# Patient Record
Sex: Female | Born: 1970 | Race: White | Hispanic: No | Smoking: Current every day smoker
Health system: Southern US, Community
[De-identification: ages and names within clinical notes are randomized; demographics above are authoritative.]

## PROBLEM LIST (undated history)

## (undated) DIAGNOSIS — Z9889 Other specified postprocedural states: Secondary | ICD-10-CM

## (undated) DIAGNOSIS — T8859XA Other complications of anesthesia, initial encounter: Secondary | ICD-10-CM

## (undated) DIAGNOSIS — E785 Hyperlipidemia, unspecified: Secondary | ICD-10-CM

## (undated) DIAGNOSIS — T4145XA Adverse effect of unspecified anesthetic, initial encounter: Secondary | ICD-10-CM

## (undated) DIAGNOSIS — E079 Disorder of thyroid, unspecified: Secondary | ICD-10-CM

## (undated) DIAGNOSIS — R112 Nausea with vomiting, unspecified: Secondary | ICD-10-CM

## (undated) HISTORY — PX: HAND SURGERY: SHX662

## (undated) HISTORY — DX: Hyperlipidemia, unspecified: E78.5

## (undated) HISTORY — PX: OTHER SURGICAL HISTORY: SHX169

## (undated) HISTORY — DX: Disorder of thyroid, unspecified: E07.9

---

## 2001-09-16 ENCOUNTER — Encounter: Payer: Self-pay | Admitting: Family Medicine

## 2001-09-16 ENCOUNTER — Ambulatory Visit (HOSPITAL_COMMUNITY): Admission: RE | Admit: 2001-09-16 | Discharge: 2001-09-16 | Payer: Self-pay | Admitting: Family Medicine

## 2001-10-02 ENCOUNTER — Encounter: Payer: Self-pay | Admitting: Family Medicine

## 2001-10-02 ENCOUNTER — Ambulatory Visit (HOSPITAL_COMMUNITY): Admission: RE | Admit: 2001-10-02 | Discharge: 2001-10-02 | Payer: Self-pay | Admitting: Family Medicine

## 2002-06-15 ENCOUNTER — Ambulatory Visit (HOSPITAL_COMMUNITY): Admission: RE | Admit: 2002-06-15 | Discharge: 2002-06-15 | Payer: Self-pay | Admitting: Family Medicine

## 2002-06-15 ENCOUNTER — Encounter: Payer: Self-pay | Admitting: Family Medicine

## 2002-11-05 ENCOUNTER — Encounter: Payer: Self-pay | Admitting: Family Medicine

## 2002-11-05 ENCOUNTER — Ambulatory Visit (HOSPITAL_COMMUNITY): Admission: RE | Admit: 2002-11-05 | Discharge: 2002-11-05 | Payer: Self-pay | Admitting: Family Medicine

## 2003-07-27 ENCOUNTER — Ambulatory Visit (HOSPITAL_COMMUNITY): Admission: RE | Admit: 2003-07-27 | Discharge: 2003-07-27 | Payer: Self-pay | Admitting: Family Medicine

## 2003-09-07 ENCOUNTER — Encounter (INDEPENDENT_AMBULATORY_CARE_PROVIDER_SITE_OTHER): Payer: Self-pay | Admitting: Specialist

## 2003-09-07 ENCOUNTER — Ambulatory Visit (HOSPITAL_COMMUNITY): Admission: RE | Admit: 2003-09-07 | Discharge: 2003-09-08 | Payer: Self-pay | Admitting: General Surgery

## 2003-09-10 ENCOUNTER — Inpatient Hospital Stay (HOSPITAL_COMMUNITY): Admission: EM | Admit: 2003-09-10 | Discharge: 2003-09-14 | Payer: Self-pay | Admitting: Emergency Medicine

## 2003-09-15 ENCOUNTER — Encounter (HOSPITAL_COMMUNITY): Admission: RE | Admit: 2003-09-15 | Discharge: 2003-10-15 | Payer: Self-pay | Admitting: General Surgery

## 2003-09-19 ENCOUNTER — Emergency Department (HOSPITAL_COMMUNITY): Admission: EM | Admit: 2003-09-19 | Discharge: 2003-09-19 | Payer: Self-pay | Admitting: *Deleted

## 2004-03-31 ENCOUNTER — Emergency Department (HOSPITAL_COMMUNITY): Admission: EM | Admit: 2004-03-31 | Discharge: 2004-04-01 | Payer: Self-pay | Admitting: Emergency Medicine

## 2004-07-11 IMAGING — CR DG CHEST 2V
2 series · 2 of 2 positions shown · non-contrast
Comparison: none

CLINICAL DATA: Smoker, hypothyroidism, thyroid goiter, pre-thyroidectomy evaluation.
 TWO VIEW CHEST ? 09/02/03 
 Normal-sized heart.  Minimal diffuse peribronchial thickening.  Minimal thoracic spine degenerative changes.

[view not recorded (1 of 2)]
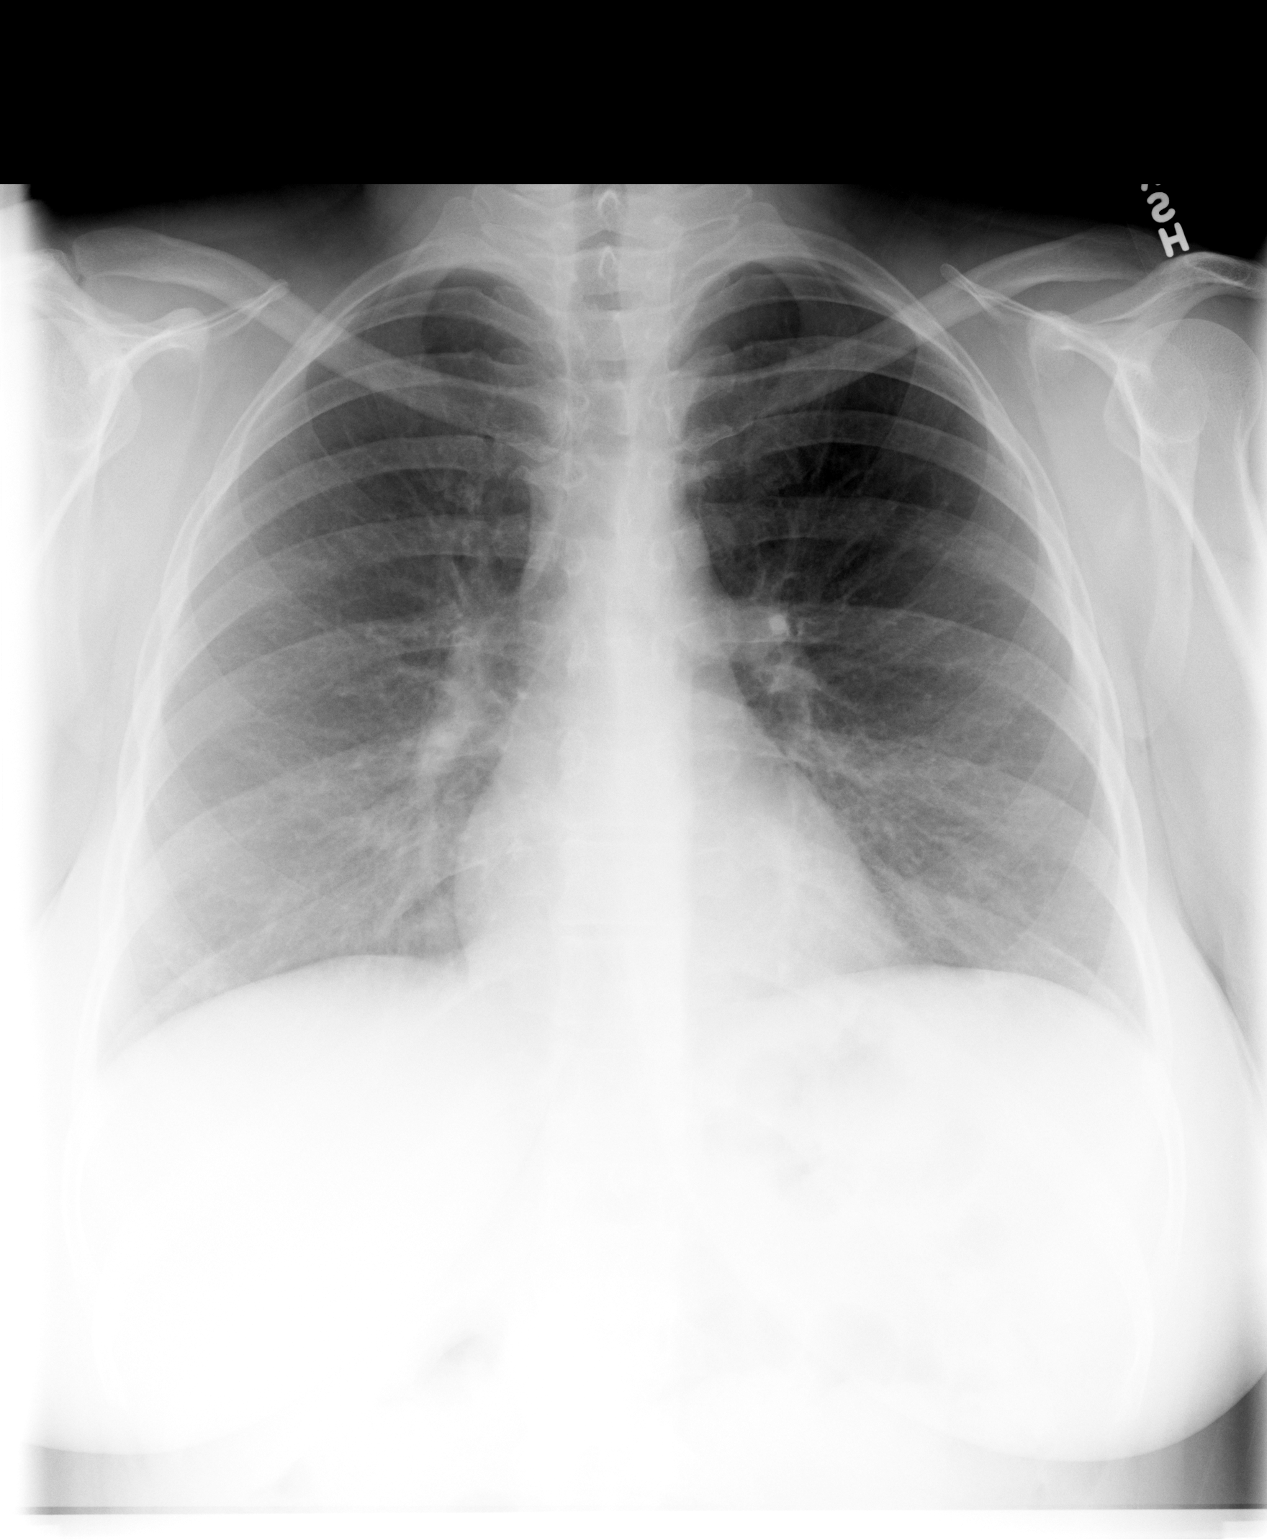

[view not recorded (2 of 2)]
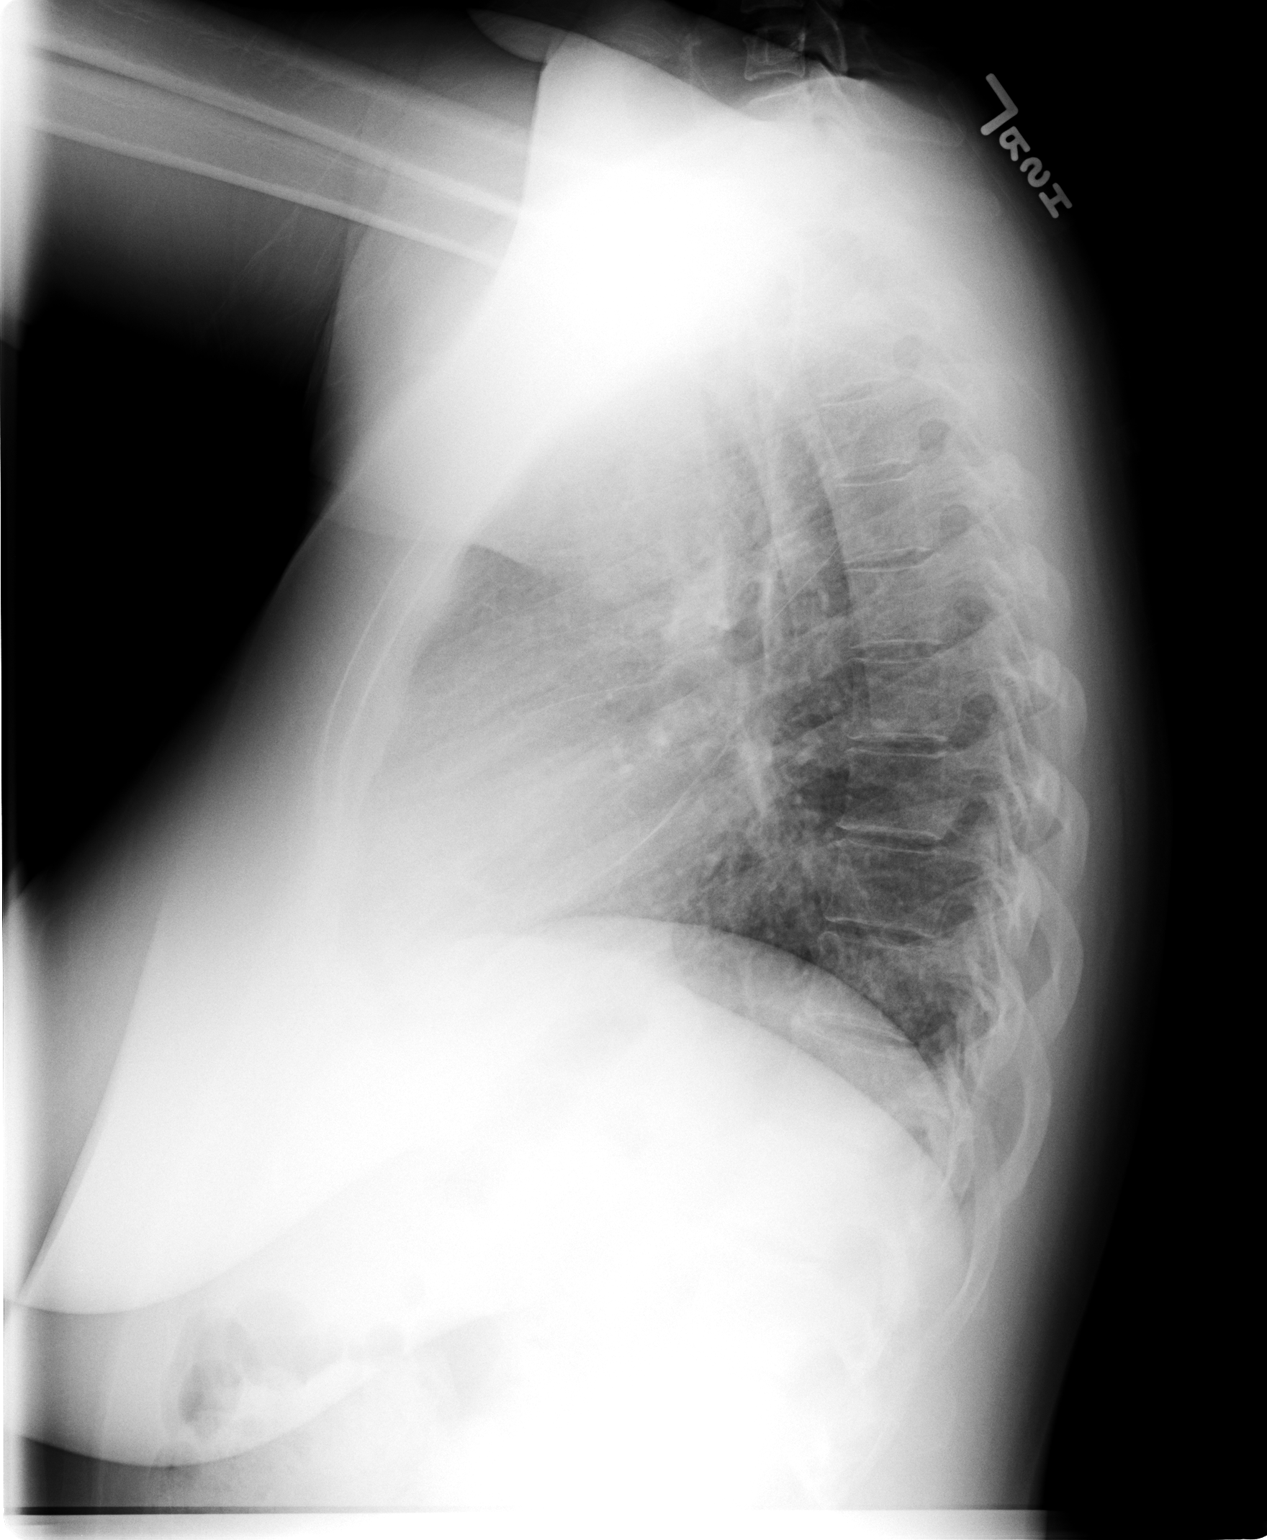

[2 of 2 positions shown; findings below may reference images not displayed]

IMPRESSION: Minimal chronic bronchitic changes.

## 2005-02-04 ENCOUNTER — Ambulatory Visit (HOSPITAL_COMMUNITY): Admission: RE | Admit: 2005-02-04 | Discharge: 2005-02-04 | Payer: Self-pay | Admitting: Family Medicine

## 2007-05-28 HISTORY — PX: THYROIDECTOMY: SHX17

## 2008-11-29 ENCOUNTER — Ambulatory Visit (HOSPITAL_COMMUNITY): Admission: RE | Admit: 2008-11-29 | Discharge: 2008-11-29 | Payer: Self-pay | Admitting: Obstetrics and Gynecology

## 2010-05-27 HISTORY — PX: ABDOMINAL HYSTERECTOMY: SHX81

## 2010-06-18 LAB — CBC
HCT: 33.9 % — ABNORMAL LOW (ref 36.0–46.0)
Hemoglobin: 10.2 g/dL — ABNORMAL LOW (ref 12.0–15.0)
MCV: 68.2 fL — ABNORMAL LOW (ref 78.0–100.0)
RBC: 4.97 MIL/uL (ref 3.87–5.11)
WBC: 11.7 10*3/uL — ABNORMAL HIGH (ref 4.0–10.5)

## 2010-06-18 LAB — SURGICAL PCR SCREEN: MRSA, PCR: NEGATIVE

## 2010-06-19 ENCOUNTER — Ambulatory Visit (HOSPITAL_COMMUNITY)
Admission: RE | Admit: 2010-06-19 | Discharge: 2010-06-20 | Payer: Self-pay | Source: Home / Self Care | Attending: Obstetrics and Gynecology | Admitting: Obstetrics and Gynecology

## 2010-06-19 ENCOUNTER — Encounter (INDEPENDENT_AMBULATORY_CARE_PROVIDER_SITE_OTHER): Payer: Self-pay | Admitting: Obstetrics and Gynecology

## 2010-06-20 LAB — HCG, SERUM, QUALITATIVE: Preg, Serum: NEGATIVE

## 2010-06-20 LAB — CBC
HCT: 27 % — ABNORMAL LOW (ref 36.0–46.0)
Hemoglobin: 8.3 g/dL — ABNORMAL LOW (ref 12.0–15.0)
WBC: 11.4 10*3/uL — ABNORMAL HIGH (ref 4.0–10.5)

## 2010-06-29 NOTE — Discharge Summary (Signed)
  NAME:  Tricia Mcdonald, Tricia Mcdonald NO.:  [id number]  MEDICAL RECORD NO.:  [id number]          PATIENT TYPE:  OIB  LOCATION:  9319                          FACILITY:  [facility]  PHYSICIAN:  Juluis Mire, M.D.   DATE OF BIRTH:  03-24-1971  DATE OF ADMISSION:  06/19/2010 DATE OF DISCHARGE:  06/20/2010                              DISCHARGE SUMMARY   ADMITTING DIAGNOSIS:  Abnormal uterine bleeding.  DISCHARGE DIAGNOSIS:  Abnormal uterine bleeding.  PROCEDURES:  Laparoscopic-assisted vaginal hysterectomy.  For complete history and physical, please see dictated note.  COURSE IN THE HOSPITAL:  The patient underwent laparoscopic-assisted vaginal hysterectomy.  Postop hemoglobin was 8.1.  She was discharged home on her first postop day.  At that time she was afebrile.  Abdomen was soft.  Bowel sounds were active.  Incision was clear.  She was voiding without difficulty, had no active vaginal bleeding.  In terms of complications, none were encountered during the stay in the hospital.  The patient was discharged home in stable condition.  DISPOSITION:  The patient to avoid heavy lifting, vaginal entry, driving a car.  She is to watch for signs of infection, nausea, vomiting, active vaginal bleeding or increasing abdominal pain.  Also instructed in signs and symptoms of deep venous thrombosis and pulmonary embolus. Discharged home on Tylox as needed for pain.  PLAN:  Follow up in the office in 1 week.     Juluis Mire, M.D.     JSM/MEDQ  D:  06/20/2010  T:  06/21/2010  Job:  [id number]  Electronically Signed by Richardean Chimera M.D. on 06/29/2010 01:35:20 PM

## 2010-06-29 NOTE — Op Note (Signed)
NAME:  Tricia Mcdonald, CINCO NO.:  [id number]  MEDICAL RECORD NO.:  [id number]          PATIENT TYPE:  OIB  LOCATION:  9319                          [facility]ACILITY:  [facility]  PHYSICIAN:  Juluis Mire, M.D.   DATE OF BIRTH:  10-30-1970  DATE OF PROCEDURE:  06/19/2010 DATE OF DISCHARGE:                              OPERATIVE REPORT   PREOPERATIVE DIAGNOSIS:  Abnormal uterine bleeding.  POSTOPERATIVE DIAGNOSIS:  Abnormal uterine bleeding.  OPERATIVE PROCEDURE:  Laparoscopic-assisted vaginal hysterectomy.  SURGEON:  Juluis Mire, MD.  ANESTHESIA:  General endotracheal.  ESTIMATED BLOOD LOSS:  300 mL.  PACKS AND DRAINS:  None.  INTRAOPERATIVE BLOOD PLACED:  None.  COMPLICATIONS:  None.  INDICATIONS:  Dictated in the history and physical.  DESCRIPTION OF PROCEDURE:  The patient was taken to the OR, placed in the supine position.  After satisfactory level of general anesthesia was obtained, the patient was placed in the dorsal lithotomy position.  Abdomen was prepped with ChloraPrep and the vagina and perineum were prepped out with Betadine.  Bladder was emptied with in-and-out catheterization.  A Hulka tenaculum was put in place and secured.  The patient was draped in a sterile field.  Subumbilical incision was made with a knife.  Veress needle was introduced into the abdominal cavity. Abdomen was insufflated with approximately 3 liters of carbon dioxide. The operating laparoscope was introduced.  There was no evidence of injury to adjacent organs.  Upper abdomen including liver and tip of the gallbladder, both lateral gutters including appendix, uterus, tubes, and ovaries were unremarkable.  A 5-mm trocar was put in place in the suprapubic area under direct visualization.  Using the EnSeal, both uteroovarian pedicles were cauterized and incised, both tubes were cauterized and incised, and both round ligaments were cauterized and incised, thus freeing the adnexa  from the side of the uterus.  At this point in time, the abdomen was deinflated with carbon dioxide. The laparoscope was removed.  The patient's legs were repositioned.  The Hulka tenaculum was taken out.  A weighted speculum was placed in the vaginal vault.  Cervix was grasped with Christella Hartigan tenaculum.  Cul-de-sac was entered sharply.  Both uterosacral ligaments were clamped, cut, and suture ligated with 0 Vicryl.  The reflection of the vaginal mucosa was incised and the bladder was dissected superiorly.  Paracervical tissue was clamped, cut, and suture ligated with 0 Vicryl.  The vesicouterine space was identified and entered sharply.  The retractors were put in place to retract the bladder superiorly.  Using clamp, cut and, tie technique with suture ligature of 0 Vicryl, parametrium was serially separated from sides of uterus.  The uterus was then flipped.  Remaining pedicles were clamped and cut.  Uterus and cervix were passed off the operative field.  Held pedicles, secured free tie of 0 Vicryl. Posterior vaginal cuff was run with a running locking suture of 0 chromic.  Uterosacral plication stitch of 0 Vicryl was put in place and secured.  Vaginal mucosa was reapproximated with interrupted sutures of 2-0 Monocryl.  A Foley was placed to straight drain.  We had clear urine  output.  Laparoscope was reintroduced.  Some oozing was noted from the right side of the vaginal cuff, brought control using the bipolar.  Otherwise, we had good hemostasis and clear urine output.  The abdomen was deflated with carbon dioxide.  All trocars were removed.  Subumbilical incision was closed with interrupted subcuticular suture of 4-0 Vicryl. Suprapubic incision was closed with Dermabond.  The patient was taken out of dorsal lithotomy position.  Once alert and extubated, transferred to recovery room in good condition.  Sponge, instrument, and needle count was correct by circulating nurse x2.  Foley  catheter remained clear at the time of closure.     Juluis Mire, M.D.     JSM/MEDQ  D:  06/19/2010  T:  06/19/2010  Job:  [id number]  Electronically Signed by Richardean Chimera M.D. on 06/29/2010 01:35:25 PM

## 2010-06-29 NOTE — H&P (Signed)
NAME:  QUETZALLI, CLOS NO.:  [id number]  MEDICAL RECORD NO.:  [id number]          PATIENT TYPE:  AMB  LOCATION:  SDC                           FACILITY:  [facility]  PHYSICIAN:  Juluis Mire, M.D.   DATE OF BIRTH:  [birth year]  DATE OF ADMISSION:  06/19/2010 DATE OF DISCHARGE:                             HISTORY & PHYSICAL   CHIEF COMPLAINT:  The patient is a 40 year old, gravida 2, para 3 female who presents for LAVH.  HISTORY OF PRESENT ILLNESS:  The patient has been having trouble with increasing menstrual irregularities.  She has extremely heavy flow with clots.  This have been associated with anemia with hemoglobin as low as 10.3.  She underwent a saline infusion ultrasound that was unremarkable. Probably we are dealing with adenomyosis along with anovulatory cycling. Does not tobacco use, we could not use birth control pills.  We tried cycling with progesterone agents and were unsuccessful.  She continuedto have very heavy flow with clots and dysmenorrhea.  Because of this the patient now presents for laparoscopic-assisted vaginal hysterectomy. Other alternatives including the IUD and ablation have been discussed.  IN TERMS OF ALLERGIES:  No known drug allergies.  MEDICATIONS:  She is on levothyroxine 112 mcg per day.  PAST MEDICAL HISTORY:  She has had usual childhood diseases, did have previous thyroidectomy for which she is on Synthroid replacement, had previous cervical conization in 1996, followup has been negative, also had a mid urethral sling done in 2010, has had good results with that. She has had 3 vaginal deliveries.  FAMILY HISTORY:  Noncontributory.  SOCIAL HISTORY:  One pack per day tobacco use.  No alcohol use.  REVIEW OF SYSTEMS:  Noncontributory.  PHYSICAL EXAMINATION:  VITAL SIGNS:  The patient is afebrile with stable vital signs. HEENT:  The patient is normocephalic.  Pupils equal, round, reactive to light and accommodation.   Extraocular are intact.  Sclerae and conjunctivae are clear.  Oropharynx is clear. NECK:  Without thyromegaly. BREASTS:  No discrete masses. LUNGS:  Clear. CARDIOVASCULAR SYSTEM:  Regular rhythm and rate without murmurs or gallops. ABDOMINAL:  Benign.  No mass, organomegaly, or tenderness. PELVIC:  Normal external genitalia.  Vaginal mucosa is clear.  Cervix is unremarkable.  Uterus is normal size, shape, and contour.  Adnexa is free of masses or tenderness. EXTREMITIES:  Trace edema. NEUROLOGIC:  Grossly normal limits.  IMPRESSION: 1. Abnormal uterine bleeding, unresponsive to conservative management. 2. Previous midurethral sling. 3. Previous thyroidectomy.  PLAN:  The patient will under go laparoscopic-assisted vaginal hysterectomy.  The risks of surgery have been discussed with the risk of infection.  Risk of hemorrhage that could require transfusion with risk of AIDS or hepatitis.  Risk of injury to adjacent organs including bladder, bowel, ureters that could require further exploratory surgery. Risk of deep venous thrombosis and pulmonary embolus.  The patient expressed understanding of potential risks and complications and alternatives.     Juluis Mire, M.D.     JSM/MEDQ  D:  06/19/2010  T:  06/19/2010  Job:  [id number]  Electronically Signed by Richardean Chimera M.D. on 06/29/2010 01:35:23  PM

## 2010-09-02 LAB — HCG, SERUM, QUALITATIVE: Preg, Serum: NEGATIVE

## 2010-09-02 LAB — CBC
HCT: 35.8 % — ABNORMAL LOW (ref 36.0–46.0)
Platelets: 343 10*3/uL (ref 150–400)
RDW: 19.3 % — ABNORMAL HIGH (ref 11.5–15.5)
WBC: 8.8 10*3/uL (ref 4.0–10.5)

## 2010-10-09 NOTE — Op Note (Signed)
NAME:  Tricia Mcdonald, Tricia Mcdonald NO.:  [id number]   MEDICAL RECORD NO.:  [id number]          PATIENT TYPE:  AMB   LOCATION:  SDC                           [facility]ACILITY:  [facility]   PHYSICIAN:  Juluis Mire, M.D.   DATE OF BIRTH:  Apr 22, 1971   DATE OF PROCEDURE:  DATE OF DISCHARGE:  11/29/2008                               OPERATIVE REPORT   PREOPERATIVE DIAGNOSIS:  Stress urinary incontinence.   POSTOPERATIVE DIAGNOSIS:  Stress urinary incontinence.   PROCEDURES:  1. Mid-urethral sling using a transobturator approach.  2. Cystoscopy.   SURGEON:  Juluis Mire, MD   ANESTHESIA:  General endotracheal.   ESTIMATED BLOOD LOSS:  Minimal.   PACKS AND DRAINS:  None.   INTRAOPERATIVE BLOOD PLACED:  None.   COMPLICATIONS:  None.   INDICATIONS:  Dictated in history and physical.   PROCEDURE IN DETAIL:  The patient was taken to OR and placed in supine  position.  After satisfactory level of general endotracheal anesthesia  was obtained, the patient was placed in dorsal lithotomy position using  Allen stirrups.  Perineum and vagina prepped out with Betadine and  draped sterile field.  A weighted speculum was placed in the vaginal  vault.  Mid urethral area was identified and injected with 1% Xylocaine  with epinephrine.  This area injected out laterally also.  An incision  was made in the vaginal mucosa overlying the mid-urethral section.  We  then dissected out laterally on both sides to the obturator foramen.  A  spot on the groin was identified at the level of clitoris below the  abductus longus tendon and just lateral to the inferior pubic ramus.  Using the halo system, both needles were passed through the skin around  the inferior pubic ramus and out to the vaginal incision on both sides.  Cystoscopy was performed.  There was no evidence of urethral or bladder  injury.  Both ureteral orifices were identified and noted to be spilling  urine.  The polypropylene mesh was  brought in place, secured to the  needles and brought out through the skin incisions.  It was  appropriately tightened to the mid urethra until it lay flat but we  could easily put a Kelly and rotated 9 degrees.  At this point in time,  the plastic sleeves were removed.  The arms of the mesh were trimmed to  the level of skin.  Vaginal mucosa was closed with running suture of 2-0  Vicryl.  Skin was  closed with Dermabond.  A Foley was placed to straight drain.  The  patient taken out of dorsal lithotomy position.  Once alert and  extubated, transferred to recovery room in good condition.  Sponge,  instrument, needle count was correct by circulating nurse.      Juluis Mire, M.D.  Electronically Signed     JSM/MEDQ  D:  11/29/2008  T:  11/30/2008  Job:  [id number]

## 2010-10-09 NOTE — H&P (Signed)
NAME:  Tricia Mcdonald, LINT NO.:  [id number]   MEDICAL RECORD NO.:  [id number]          PATIENT TYPE:  AMB   LOCATION:  SDC                           [facility]ACILITY:  [facility]   PHYSICIAN:  Juluis Mire, M.D.   DATE OF BIRTH:  25-Jul-1970   DATE OF ADMISSION:  11/29/2008  DATE OF DISCHARGE:                              HISTORY & PHYSICAL   The patient is a 40 year old, gravida 3, para 3 female, who presents for  mid urethral sling.  The patient has trouble with leaking of urine when  coughing and sneezing and exercise.  Underwent urodynamic testing in the  office.  She had an minimal postvoid residual.  During stressful events  such as coughing and Valsalva, she did leak urine, but had normal leak  point pressures.  Urethral pressure profiles also normal.  No  uninhibited bladder contractions were elicited during the filling phase.  During the uroflow, there was no evidence of any obstructive voiding  pattern.  In view of this, the patient has decided to proceed with a mid  urethral sling.  The nature of the procedure have been discussed along  with alternatives.   In terms of allergies, no known drug allergies.   MEDICATIONS:  She is going to leave on Synthroid 150 mcg per day.   PAST MEDICAL HISTORY:  She had a thyroidectomy for goiter is on  Synthroid replacement.  She has also had a previous conization of the  cervix.  Followup so far has been negative.   OBSTETRICAL HISTORY:  Three vaginal deliveries.   SOCIAL HISTORY:  One-pack per day tobacco use.  No alcohol use.   FAMILY HISTORY:  Noncontributory.   REVIEW OF SYSTEMS:  Noncontributory.   PHYSICAL EXAMINATION:  VITAL SIGNS:  The patient is afebrile, stable  vital signs.  HEENT:  The patient is normocephalic.  Pupils equal, round, and reactive  to light and accommodation.  Extraocular movements were intact.  Sclerae  and conjunctivae clear.  Oropharynx.  Oropharynx clear.  NECK:  Without thyromegaly.  Previous  thyroidectomy scar noted.  BREASTS:  Not examined.  LUNGS:  Clear.  CARDIOVASCULAR:  Regular rate without murmurs or gallops.  ABDOMEN:  Benign.  No mass, organomegaly, or tenderness.  PELVIC:  Normal external genitalia.  Vaginal mucosa.  Mild  cystourethrocele.  Uterus normal size, shape, and contour.  Adnexa free  of mass or tenderness.  EXTREMITIES:  Trace edema:  NEUROLOGIC:  Grossly within normal limits.   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   PLAN:  The patient undergo mid urethral sling.  The nature of procedure  and alternatives were discussed.  Discussed the risks which include the  risk of infection.  The risk of hemorrhage that could require a  transfusion with the risk of AIDS or hepatitis.  Risk of injury to  adjacent organs including bladder, bowel, ureters could require further  exploratory surgery.  The risk of mesh erosion that could require  surgical removal.  The risk of over tightening that could lead to  inability to void requiring taking  down the sling with the return of  incontinence.  Risk of obturator nerve injury that could lead to chronic  leg pain and weakness.  Risk of deep venous thrombosis and pulmonary  embolus.  The patient expressed understanding of the indications and  risks.      Juluis Mire, M.D.  Electronically Signed     JSM/MEDQ  D:  11/29/2008  T:  11/29/2008  Job:  [id number]

## 2010-10-12 NOTE — Discharge Summary (Signed)
NAME:  Tricia Mcdonald, Tricia Mcdonald                         ACCOUNT NO.:  [id number]   MEDICAL RECORD NO.:  [id number]                   PATIENT TYPE:  INP   LOCATION:  5032                                 FACILITY:  [facility]   PHYSICIAN:  Sharlet Salina T. Hoxworth, M.D.          DATE OF BIRTH:  May 29, 1970   DATE OF ADMISSION:  09/10/2003  DATE OF DISCHARGE:  09/14/2003                                 DISCHARGE SUMMARY   DISCHARGE DIAGNOSIS:  Hypocalcemia, post total thyroidectomy.   OPERATIONS AND PROCEDURES:  None.   HISTORY OF PRESENT ILLNESS:  Tricia Mcdonald is a 40 year old white female,  status post total thyroidectomy 3 days prior to this admission.  Diagnosis  was Hashimoto's thyroiditis.  She presents with a 24-hour history of facial  tingling and later today, cramping in her right hand and arm.   PAST MEDICAL HISTORY:  Past medical history as above.  No other significant  medical or surgical problems.   CURRENT MEDICATIONS:  1. Calcium with D 2 tablets 4 times daily.  2. Synthroid 200 mcg daily.  3. P.r.n. Vicodin.   ALLERGIES:  No known drug allergies.   SOCIAL HISTORY, FAMILY HISTORY AND REVIEW OF SYSTEMS:  See recent detailed  H&P.   PERTINENT PHYSICAL EXAM:  VITAL SIGNS:  She is afebrile, vital signs all  within normal limits.  GENERAL:  Alert, healthy-appearing white female in no acute distress.  HEENT/NECK:  HEENT shows a nicely healing neck incision without  complication.  NEUROLOGIC:  There is a positive Chvostek's sign.  Remainder of neurological  exam is unremarkable.   LABORATORY:  A calcium was 6.5; remainder of electrolytes normal.   HOSPITAL COURSE:  The patient was admitted with the impression of  postoperative hypocalcemia.  She was treated with IV calcium gluconate 1 g  and was increased to calcium with D 3 tabs 4 times daily.  On September 11, 2003, the calcium was 7.2 and then by the 19th, had fallen to 6.9; she  received 2 further ampules of calcium gluconate with a  calcium of 7.3.  By  September 14, 2003, her calcium was ranging between 7.9 and 7.1 daily, requiring  1 ampule of calcium gluconate IV daily in addition to her p.o. medications;  this included the addition of Rocaltrol 0.25 mg twice daily.  At this point,  she was very anxious to leave the hospital, was entirely asymptomatic, but  clearly required at this point, IV calcium supplementation.  We did arrange  for her to have 1 ampule of IV calcium gluconate given at  [facility] on outpatient daily for the next 3 days, at which point  she is to follow up with me after having a serum calcium drawn.  She is  entirely asymptomatic at this point and is aware of the need to call  immediately for any recurrent symptoms.  Lorne Skeens. Hoxworth, M.D.    Tory Emerald  D:  10/11/2003  T:  10/12/2003  Job:  [id number]

## 2010-10-12 NOTE — Op Note (Signed)
NAME:  Tricia Mcdonald, Tricia Mcdonald                         ACCOUNT NO.:  [id number]   MEDICAL RECORD NO.:  [id number]                   PATIENT TYPE:  OIB   LOCATION:  5725                                 FACILITY:  [facility]   PHYSICIAN:  Sharlet Salina T. Hoxworth, M.D.          DATE OF BIRTH:  [birth year]   DATE OF PROCEDURE:  09/07/2003  DATE OF DISCHARGE:                                 OPERATIVE REPORT   PREOPERATIVE DIAGNOSIS:  Multinodular goiter.   POSTOPERATIVE DIAGNOSIS:  Multinodular goiter.   PROCEDURE:  Total thyroidectomy.   SURGEON:  Lorne Skeens. Hoxworth, M.D.   ASSISTANT:  Rose Phi. Maple Hudson, M.D.   ANESTHESIA:  General.   BRIEF HISTORY:  Tricia Mcdonald is a 40 year old white female with a long  history of a progressively enlarging multinodular goiter despite adequate  thyroid suppression.  She has pressure symptoms, discomfort in her neck, and  mild dysphagia.  There was a somewhat dominant mass in the right lobe and  fine needle laceration has shown chronic thyroiditis.  Due to progressive  enlargement on the thyroid replacement and symptoms, thyroidectomy has been  recommended and accepted.  The nature of the procedure, its indications,  alternatives, and risks of bleeding, infection, recurrent laryngeal nerve  injury with permanent hoarseness, and injury to the parathyroid glands with  permanent hypocalcemia, have been discussed and understood.  She is now  brought to the operating room for this procedure.   DESCRIPTION OF PROCEDURE:  The patient was brought to the operating room and  placed in supine position on the operating table and general endotracheal  anesthesia was induced.  A shoulder roll was placed and she was carefully  positioned with the neck extended and the neck widely sterilely prepped and  draped.  A curvilinear incision was made in a skin crease 2 fingerbreadths  above the sternal notch and dissection was carried down through the  subcutaneous tissue and platysma  using cautery.  Subplatysmal flaps were  then raised superiorly to the thyroid cartilage, inferior to the sternal  notch, and laterally out to the sternocleidomastoid muscles.  The Mahorner  retractor was placed.  The strap muscles were divided in the midline and  dissection was carried down onto the surface of the gland.  The left lobe  was approached first.  Strap muscles were dissected up off the surface of  the gland.  The gland was quite enlarged, firm, nodular, and did have some  evidence of chronic thyroiditis with some chronic adhesions to the surface  of the gland.  The gland was bluntly mobilized  anteriorly.  The middle  thyroid vein was identified and divided between clamps and tied with 2-0  silk ties.  The gland was further mobilized with blunt dissection.  The  inferior pole directly over the trachea was mobilized and vessels divided  here between clamps and tied with 2-0 silk ties, keeping the dissection  purely anterior to the trachea.  The superior pole was then exposed and with  blunt dissection, the superior thyroid vessels identified, divided between  clamps, doubly tied proximally and tied distally with 2-0 silk completely  mobilizing the superior pole.  The midline was then bluntly dissected  further down to the middle thyroid artery pedicle.  We had seen what  appeared to be one parathyroid gland superiorly very near the superior pole  and vessels and a second parathyroid gland was felt to be identified  adjacent to the inferior thyroid artery.  Dissection of the  tracheoesophageal groove identified the recurrent laryngeal nerve and  dissection was kept anterior to this.  Individual small branches of the  inferior thyroid artery were then controlled between small clips or doubly  clamped and tied and the gland mobilized up onto the suspensory ligament of  Berry on the trachea which was then divided with cautery completely freeing  the gland to the isthmus.  The left  lobe was then replaced into its anatomic  position and the right lobe was exposed in an identical fashion.  The  findings were very similar.  The gland was mobilized in an identical  fashion.  One parathyroid gland was felt to be identified near the inferior  thyroid artery.  The recurrent laryngeal nerve was identified and protected.  The gland was dissected up in an identical fashion.  Finally, a small  pyramidal lobe was dissected off the trachea with cautery and the gland  removed.  The neck was irrigated and hemostasis assured.  Surgicel was  placed on either side of the neck and the strap muscles were closed in the  midline with interrupted 3-0 Vicryl.  The platysmal muscle was closed with  interrupted 3-0 Vicryl and the skin was closed with staples and Steri-  Strips.  Sponge and needle counts were correct.  Dry, sterile dressing was  applied.  The patient was taken to the recovery room in stable condition.                                               Lorne Skeens. Hoxworth, M.D.    Tory Emerald  D:  09/07/2003  T:  09/07/2003  Job:  [id number]

## 2012-09-25 ENCOUNTER — Other Ambulatory Visit: Payer: Self-pay | Admitting: *Deleted

## 2012-09-25 MED ORDER — LEVOTHYROXINE SODIUM 137 MCG PO TABS: 137.0000 ug | ORAL_TABLET | Freq: Every day | ORAL | Status: DC

## 2013-12-23 ENCOUNTER — Other Ambulatory Visit: Payer: Self-pay | Admitting: Nurse Practitioner

## 2013-12-23 ENCOUNTER — Telehealth: Payer: Self-pay | Admitting: Nurse Practitioner

## 2013-12-23 NOTE — Telephone Encounter (Signed)
Please find out if patient has insurance and if she has had other routine labs run elsewhere (sugar, cholesterol, etc.)

## 2013-12-23 NOTE — Telephone Encounter (Signed)
Last labs were hosp encounter 2012  See chart

## 2013-12-23 NOTE — Telephone Encounter (Signed)
bw orders for appt to recheck TSH an what ever else you think she needs  call when sent

## 2013-12-23 NOTE — Telephone Encounter (Signed)
Patient she has [employer] now and has had no other blood work

## 2013-12-24 ENCOUNTER — Other Ambulatory Visit: Payer: Self-pay | Admitting: Nurse Practitioner

## 2013-12-24 DIAGNOSIS — Z0189 Encounter for other specified special examinations: Secondary | ICD-10-CM

## 2013-12-24 DIAGNOSIS — E039 Hypothyroidism, unspecified: Secondary | ICD-10-CM | POA: Insufficient documentation

## 2013-12-24 DIAGNOSIS — D509 Iron deficiency anemia, unspecified: Secondary | ICD-10-CM | POA: Insufficient documentation

## 2013-12-24 DIAGNOSIS — L409 Psoriasis, unspecified: Secondary | ICD-10-CM

## 2013-12-24 DIAGNOSIS — E781 Pure hyperglyceridemia: Secondary | ICD-10-CM | POA: Insufficient documentation

## 2014-01-13 ENCOUNTER — Ambulatory Visit: Payer: Self-pay | Admitting: Nurse Practitioner

## 2014-09-19 ENCOUNTER — Other Ambulatory Visit: Payer: Self-pay | Admitting: Physician Assistant

## 2014-12-28 ENCOUNTER — Other Ambulatory Visit: Payer: Self-pay | Admitting: Obstetrics and Gynecology

## 2014-12-28 DIAGNOSIS — N644 Mastodynia: Secondary | ICD-10-CM

## 2015-01-04 ENCOUNTER — Other Ambulatory Visit: Payer: Self-pay

## 2015-09-18 DIAGNOSIS — L409 Psoriasis, unspecified: Secondary | ICD-10-CM | POA: Diagnosis not present

## 2015-09-18 DIAGNOSIS — Z79899 Other long term (current) drug therapy: Secondary | ICD-10-CM | POA: Diagnosis not present

## 2015-12-01 DIAGNOSIS — G8929 Other chronic pain: Secondary | ICD-10-CM | POA: Diagnosis not present

## 2015-12-01 DIAGNOSIS — M5442 Lumbago with sciatica, left side: Secondary | ICD-10-CM | POA: Diagnosis not present

## 2015-12-01 DIAGNOSIS — M5441 Lumbago with sciatica, right side: Secondary | ICD-10-CM | POA: Diagnosis not present

## 2015-12-01 DIAGNOSIS — M5136 Other intervertebral disc degeneration, lumbar region: Secondary | ICD-10-CM | POA: Diagnosis not present

## 2015-12-06 DIAGNOSIS — L409 Psoriasis, unspecified: Secondary | ICD-10-CM | POA: Diagnosis not present

## 2015-12-06 DIAGNOSIS — Z79899 Other long term (current) drug therapy: Secondary | ICD-10-CM | POA: Diagnosis not present

## 2016-01-08 DIAGNOSIS — G5601 Carpal tunnel syndrome, right upper limb: Secondary | ICD-10-CM | POA: Diagnosis not present

## 2016-01-08 DIAGNOSIS — G5602 Carpal tunnel syndrome, left upper limb: Secondary | ICD-10-CM | POA: Diagnosis not present

## 2016-01-08 DIAGNOSIS — R202 Paresthesia of skin: Secondary | ICD-10-CM | POA: Diagnosis not present

## 2016-01-08 DIAGNOSIS — G5603 Carpal tunnel syndrome, bilateral upper limbs: Secondary | ICD-10-CM | POA: Diagnosis not present

## 2016-02-14 DIAGNOSIS — M65342 Trigger finger, left ring finger: Secondary | ICD-10-CM | POA: Diagnosis not present

## 2016-02-14 DIAGNOSIS — M79645 Pain in left finger(s): Secondary | ICD-10-CM | POA: Diagnosis not present

## 2016-02-14 DIAGNOSIS — G5603 Carpal tunnel syndrome, bilateral upper limbs: Secondary | ICD-10-CM | POA: Diagnosis not present

## 2016-02-19 DIAGNOSIS — L409 Psoriasis, unspecified: Secondary | ICD-10-CM | POA: Diagnosis not present

## 2016-02-19 DIAGNOSIS — Z79899 Other long term (current) drug therapy: Secondary | ICD-10-CM | POA: Diagnosis not present

## 2016-03-28 DIAGNOSIS — L409 Psoriasis, unspecified: Secondary | ICD-10-CM | POA: Diagnosis not present

## 2016-03-28 DIAGNOSIS — J069 Acute upper respiratory infection, unspecified: Secondary | ICD-10-CM | POA: Diagnosis not present

## 2016-03-28 DIAGNOSIS — E039 Hypothyroidism, unspecified: Secondary | ICD-10-CM | POA: Diagnosis not present

## 2016-03-28 DIAGNOSIS — M791 Myalgia: Secondary | ICD-10-CM | POA: Diagnosis not present

## 2016-04-01 DIAGNOSIS — R1031 Right lower quadrant pain: Secondary | ICD-10-CM | POA: Diagnosis not present

## 2016-05-08 DIAGNOSIS — L409 Psoriasis, unspecified: Secondary | ICD-10-CM | POA: Diagnosis not present

## 2016-05-08 DIAGNOSIS — Z79899 Other long term (current) drug therapy: Secondary | ICD-10-CM | POA: Diagnosis not present

## 2016-06-25 DIAGNOSIS — M65342 Trigger finger, left ring finger: Secondary | ICD-10-CM | POA: Diagnosis not present

## 2016-12-31 DIAGNOSIS — Z3483 Encounter for supervision of other normal pregnancy, third trimester: Secondary | ICD-10-CM | POA: Diagnosis not present

## 2016-12-31 DIAGNOSIS — Z3482 Encounter for supervision of other normal pregnancy, second trimester: Secondary | ICD-10-CM | POA: Diagnosis not present

## 2017-01-09 DIAGNOSIS — S46811A Strain of other muscles, fascia and tendons at shoulder and upper arm level, right arm, initial encounter: Secondary | ICD-10-CM | POA: Diagnosis not present

## 2017-01-09 DIAGNOSIS — M542 Cervicalgia: Secondary | ICD-10-CM | POA: Diagnosis not present

## 2017-01-17 DIAGNOSIS — M542 Cervicalgia: Secondary | ICD-10-CM | POA: Diagnosis not present

## 2017-01-17 DIAGNOSIS — M62838 Other muscle spasm: Secondary | ICD-10-CM | POA: Diagnosis not present

## 2017-01-17 DIAGNOSIS — Z6831 Body mass index (BMI) 31.0-31.9, adult: Secondary | ICD-10-CM | POA: Diagnosis not present

## 2017-01-20 DIAGNOSIS — M5412 Radiculopathy, cervical region: Secondary | ICD-10-CM | POA: Diagnosis not present

## 2017-01-23 DIAGNOSIS — M65321 Trigger finger, right index finger: Secondary | ICD-10-CM | POA: Diagnosis not present

## 2017-01-23 DIAGNOSIS — M65342 Trigger finger, left ring finger: Secondary | ICD-10-CM | POA: Diagnosis not present

## 2017-01-28 DIAGNOSIS — M5412 Radiculopathy, cervical region: Secondary | ICD-10-CM | POA: Diagnosis not present

## 2017-02-03 DIAGNOSIS — M5010 Cervical disc disorder with radiculopathy, unspecified cervical region: Secondary | ICD-10-CM | POA: Diagnosis not present

## 2017-02-03 DIAGNOSIS — M5020 Other cervical disc displacement, unspecified cervical region: Secondary | ICD-10-CM | POA: Diagnosis not present

## 2017-02-03 DIAGNOSIS — M5412 Radiculopathy, cervical region: Secondary | ICD-10-CM | POA: Diagnosis not present

## 2017-02-04 DIAGNOSIS — M50122 Cervical disc disorder at C5-C6 level with radiculopathy: Secondary | ICD-10-CM | POA: Diagnosis not present

## 2017-02-06 DIAGNOSIS — M65342 Trigger finger, left ring finger: Secondary | ICD-10-CM | POA: Diagnosis not present

## 2017-02-20 DIAGNOSIS — M65342 Trigger finger, left ring finger: Secondary | ICD-10-CM | POA: Diagnosis not present

## 2017-02-25 DIAGNOSIS — M542 Cervicalgia: Secondary | ICD-10-CM | POA: Diagnosis not present

## 2017-02-25 DIAGNOSIS — J343 Hypertrophy of nasal turbinates: Secondary | ICD-10-CM | POA: Diagnosis not present

## 2017-02-25 DIAGNOSIS — J3801 Paralysis of vocal cords and larynx, unilateral: Secondary | ICD-10-CM | POA: Diagnosis not present

## 2017-02-25 DIAGNOSIS — R061 Stridor: Secondary | ICD-10-CM | POA: Diagnosis not present

## 2017-03-04 ENCOUNTER — Ambulatory Visit: Payer: Self-pay | Admitting: Physician Assistant

## 2017-04-04 ENCOUNTER — Encounter: Payer: Self-pay | Admitting: Family Medicine

## 2017-04-04 ENCOUNTER — Ambulatory Visit: Payer: BLUE CROSS/BLUE SHIELD | Admitting: Family Medicine

## 2017-04-04 VITALS — BP 130/85 | HR 72 | Temp 97.4°F | Ht 65.5 in | Wt 173.2 lb

## 2017-04-04 DIAGNOSIS — Z01818 Encounter for other preprocedural examination: Secondary | ICD-10-CM | POA: Diagnosis not present

## 2017-04-04 DIAGNOSIS — Z23 Encounter for immunization: Secondary | ICD-10-CM

## 2017-04-04 DIAGNOSIS — Z720 Tobacco use: Secondary | ICD-10-CM | POA: Diagnosis not present

## 2017-04-04 DIAGNOSIS — Z0000 Encounter for general adult medical examination without abnormal findings: Secondary | ICD-10-CM

## 2017-04-04 DIAGNOSIS — M542 Cervicalgia: Secondary | ICD-10-CM

## 2017-04-04 LAB — BAYER DCA HB A1C WAIVED: HB A1C (BAYER DCA - WAIVED): 6.1 % (ref <7.0)

## 2017-04-04 NOTE — Patient Instructions (Signed)
Great to seee you!  Come back in 3-4 months for follow up of your hypothyroidism.

## 2017-04-04 NOTE — Progress Notes (Signed)
   HPI  Patient presents today to establish care.  Patient has chronic neck pain and is planning to have surgery for this in the next few months. She has been sent for preoperative clearance. She plans to reestablish care given that she had not been to her previous PCP in more than 5 years and they have stopped accepting new patients.  She is a smoker, not very interested in quitting.  She has not been taking her Synthroid regularly. She would like to defer testing for this.  She is fasting except for a soda today.  She has had some concern for prediabetes previously, fasting blood sugar at home on a blood glucose monitor has been 103.  Preoperative discussion-patient can easily walk to the mailbox or up a flight of stairs without any chest pain or shortness of breath.  She denies any chest pain or shortness of breath.  She denies any history of cardiac condition. 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  PMH: Hyperlipidemia, thyroid disease Past surgical history: Abdominal hysterectomy, hand surgery, thyroidectomy 2009 Family history: Father with skin cancer and diabetes and hypertension, sister with diverticulitis, PGM with thyroid disease Social history: Current smoker, no alcohol use ROS: Per HPI  Objective: BP 130/85   Pulse 72   Temp (!) 97.4 F (36.3 C) (Oral)   Ht 5' 5.5" (1.664 m)   Wt 173 lb 3.2 oz (78.6 kg)   BMI 28.38 kg/m  Gen: NAD, alert, cooperative with exam HEENT: NCAT, EOMI, PERRL CV: RRR, good S1/S2, no murmur Resp: CTABL, no wheezes, non-labored Abd: SNTND, BS present, no guarding or organomegaly Ext: No edema, warm Neuro: Alert and oriented, No gross deficits  Assessment and plan:  #Preoperative clearance Patient is most likely cleared for surgery, from a cardiac standpoint I do not see any reason to hold her back. EKG reviewed today with no acute or worrisome findings. Lab work pending,  #Neck  pain Patient seeing orthopedic surgery, planning surgery Narcotic pain medications prescribed by orthopedics  #Tobacco abuse Recommended cessation, she is considering  #Annual physical exam Normal exam, discussed weight. At work filled out for work. Labs today, fasting except for one sugar sweetened soda Pap and mammogram per GYN    Orders Placed This Encounter  Procedures  . Flu Vaccine QUAD 36+ mos IM  . CBC with Differential/Platelet  . CMP14+EGFR  . Lipid panel  . Bayer DCA Hb A1c Waived  . EKG 12-Lead    Meds ordered this encounter  Medications  . levothyroxine (SYNTHROID, LEVOTHROID) 175 MCG tablet    Sig: Take 1 tablet daily by mouth.  . oxyCODONE-acetaminophen (PERCOCET) 10-325 MG tablet    Sig: Take 1 tablet every 4 (four) hours as needed by mouth.    Refill:  0    Laroy Apple, MD [facility] 04/04/2017, 4:09 PM

## 2017-04-05 LAB — CMP14+EGFR
ALBUMIN: 4.3 g/dL (ref 3.5–5.5)
ALT: 12 IU/L (ref 0–32)
AST: 20 IU/L (ref 0–40)
Albumin/Globulin Ratio: 1.5 (ref 1.2–2.2)
Alkaline Phosphatase: 61 IU/L (ref 39–117)
BUN / CREAT RATIO: 6 — AB (ref 9–23)
BUN: 5 mg/dL — AB (ref 6–24)
CHLORIDE: 100 mmol/L (ref 96–106)
CO2: 27 mmol/L (ref 20–29)
CREATININE: 0.83 mg/dL (ref 0.57–1.00)
Calcium: 9.2 mg/dL (ref 8.7–10.2)
GFR calc non Af Amer: 84 mL/min/{1.73_m2} (ref >59)
GFR, EST AFRICAN AMERICAN: 96 mL/min/{1.73_m2} (ref >59)
GLOBULIN, TOTAL: 2.9 g/dL (ref 1.5–4.5)
GLUCOSE: 73 mg/dL (ref 65–99)
Potassium: 4.3 mmol/L (ref 3.5–5.2)
SODIUM: 142 mmol/L (ref 134–144)
TOTAL PROTEIN: 7.2 g/dL (ref 6.0–8.5)

## 2017-04-05 LAB — CBC WITH DIFFERENTIAL/PLATELET
BASOS ABS: 0 10*3/uL (ref 0.0–0.2)
BASOS: 0 %
EOS (ABSOLUTE): 0.3 10*3/uL (ref 0.0–0.4)
EOS: 3 %
HEMATOCRIT: 40.8 % (ref 34.0–46.6)
HEMOGLOBIN: 14 g/dL (ref 11.1–15.9)
IMMATURE GRANS (ABS): 0 10*3/uL (ref 0.0–0.1)
Immature Granulocytes: 0 %
LYMPHS ABS: 2.9 10*3/uL (ref 0.7–3.1)
LYMPHS: 31 %
MCH: 30.9 pg (ref 26.6–33.0)
MCHC: 34.3 g/dL (ref 31.5–35.7)
MCV: 90 fL (ref 79–97)
MONOCYTES: 6 %
Monocytes Absolute: 0.5 10*3/uL (ref 0.1–0.9)
NEUTROS ABS: 5.5 10*3/uL (ref 1.4–7.0)
Neutrophils: 60 %
Platelets: 273 10*3/uL (ref 150–379)
RBC: 4.53 x10E6/uL (ref 3.77–5.28)
RDW: 14.4 % (ref 12.3–15.4)
WBC: 9.3 10*3/uL (ref 3.4–10.8)

## 2017-04-05 LAB — LIPID PANEL
Chol/HDL Ratio: 8.9 ratio — ABNORMAL HIGH (ref 0.0–4.4)
Cholesterol, Total: 248 mg/dL — ABNORMAL HIGH (ref 100–199)
HDL: 28 mg/dL — ABNORMAL LOW (ref >39)
LDL CALC: 153 mg/dL — AB (ref 0–99)
Triglycerides: 336 mg/dL — ABNORMAL HIGH (ref 0–149)
VLDL CHOLESTEROL CAL: 67 mg/dL — AB (ref 5–40)

## 2017-04-08 ENCOUNTER — Telehealth: Payer: Self-pay | Admitting: Family Medicine

## 2017-04-08 ENCOUNTER — Other Ambulatory Visit: Payer: Self-pay | Admitting: Family Medicine

## 2017-04-08 MED ORDER — PITAVASTATIN CALCIUM 1 MG PO TABS: 1.0000 mg | ORAL_TABLET | Freq: Every day | ORAL | 3 refills | Status: DC

## 2017-04-14 DIAGNOSIS — Z79899 Other long term (current) drug therapy: Secondary | ICD-10-CM | POA: Diagnosis not present

## 2017-04-14 DIAGNOSIS — L400 Psoriasis vulgaris: Secondary | ICD-10-CM | POA: Diagnosis not present

## 2017-04-15 NOTE — Telephone Encounter (Signed)
Detailed message left per Lorelee CoverJessica Rostosky, CMA in result note

## 2017-04-16 ENCOUNTER — Ambulatory Visit: Admit: 2017-04-16 | Payer: Self-pay | Admitting: Orthopedic Surgery

## 2017-04-16 SURGERY — CERVICAL ANTERIOR DISC ARTHROPLASTY
Anesthesia: General

## 2017-05-02 DIAGNOSIS — M5010 Cervical disc disorder with radiculopathy, unspecified cervical region: Secondary | ICD-10-CM | POA: Diagnosis not present

## 2017-05-07 NOTE — Pre-Procedure Instructions (Signed)
Suella BroadKarrie S Figueira  05/07/2017      [facility] [place] - [address] [place] [id number] Phone: [phone] Fax: [phone]    Your procedure is scheduled on Friday, May 16, 2017  [facility] Ad[facility] "A" at 10:15AM   Call this number if you have problems the morning of surgery:  [phone]   Remember:  Do not eat food or drink liquids after midnight.  Take these medicines the morning of surgery with A SIP OF WATER: Levothyroxine (SYNTHROID, LEVOTHROID). If needed OxyCODONE-acetaminophen (PERCOCET) for pain.  7 days before surgery (Dec. 14),  stop taking all Aspirins, Vitamins, Fish oils, and Herbal medications. Also stop all NSAIDS i.e. Advil, Ibuprofen, Motrin, Aleve, Anaprox, Naproxen, BC and Goody Powders.   Do not wear jewelry, make-up or nail polish.  Do not wear lotions, powders, perfumes, or deodorant.  Do not shave 48 hours prior to surgery.    Do not bring valuables to the hospital.  [facility] is not responsible for any belongings or valuables.  Contacts, dentures or bridgework may not be worn into surgery.  Leave your suitcase in the car.  After surgery it may be brought to your room.  For patients admitted to the hospital, discharge time will be determined by your treatment team.  Patients discharged the day of surgery will not be allowed to drive home.   Special instructions:   [place]- Preparing For Surgery  Before surgery, you can play an important role. Because skin is not sterile, your skin needs to be as free of germs as possible. You can reduce the number of germs on your skin by washing with CHG (chlorahexidine gluconate) Soap before surgery.  CHG is an antiseptic cleaner which kills germs and bonds with the skin to continue killing germs even after washing.  Please do not use if you have an allergy to CHG or antibacterial soaps. If your skin becomes reddened/irritated  stop using the CHG.  Do not shave (including legs and underarms) for at least 48 hours prior to first CHG shower. It is OK to shave your face.  Please follow these instructions carefully.   1. Shower the NIGHT BEFORE SURGERY and the MORNING OF SURGERY with CHG.   2. If you chose to wash your hair, wash your hair first as usual with your normal shampoo.  3. After you shampoo, rinse your hair and body thoroughly to remove the shampoo.  4. Use CHG as you would any other liquid soap. You can apply CHG directly to the skin and wash gently with a scrungie or a clean washcloth.   5. Apply the CHG Soap to your body ONLY FROM THE NECK DOWN.  Do not use on open wounds or open sores. Avoid contact with your eyes, ears, mouth and genitals (private parts). Wash Face and genitals (private parts)  with your normal soap.  6. Wash thoroughly, paying special attention to the area where your surgery will be performed.  7. Thoroughly rinse your body with warm water from the neck down.  8. DO NOT shower/wash with your normal soap after using and rinsing off the CHG Soap.  9. Pat yourself dry with a CLEAN TOWEL.  10. Wear CLEAN PAJAMAS to bed the night before surgery, wear comfortable clothes the morning of surgery  11. Place CLEAN SHEETS on your bed the night of your first shower and DO NOT SLEEP WITH PETS.  Day of Surgery: Do not apply any deodorants/lotions. Please wear clean clothes to the hospital/surgery center.    Please read over the following fact sheets that you were given. Pain Booklet, Coughing and Deep Breathing, MRSA Information and Surgical Site Infection Prevention

## 2017-05-08 ENCOUNTER — Encounter (HOSPITAL_COMMUNITY)
Admission: RE | Admit: 2017-05-08 | Discharge: 2017-05-08 | Disposition: A | Discharge status: Home / Self Care | Payer: BLUE CROSS/BLUE SHIELD | Source: Ambulatory Visit | Attending: Orthopedic Surgery | Admitting: Orthopedic Surgery

## 2017-05-08 ENCOUNTER — Encounter (HOSPITAL_COMMUNITY): Payer: Self-pay

## 2017-05-08 ENCOUNTER — Other Ambulatory Visit: Payer: Self-pay

## 2017-05-08 DIAGNOSIS — D509 Iron deficiency anemia, unspecified: Secondary | ICD-10-CM | POA: Insufficient documentation

## 2017-05-08 DIAGNOSIS — E039 Hypothyroidism, unspecified: Secondary | ICD-10-CM | POA: Insufficient documentation

## 2017-05-08 DIAGNOSIS — Z01812 Encounter for preprocedural laboratory examination: Secondary | ICD-10-CM | POA: Diagnosis not present

## 2017-05-08 DIAGNOSIS — E781 Pure hyperglyceridemia: Secondary | ICD-10-CM | POA: Insufficient documentation

## 2017-05-08 DIAGNOSIS — L409 Psoriasis, unspecified: Secondary | ICD-10-CM | POA: Insufficient documentation

## 2017-05-08 HISTORY — DX: Nausea with vomiting, unspecified: R11.2

## 2017-05-08 HISTORY — DX: Other specified postprocedural states: Z98.890

## 2017-05-08 HISTORY — DX: Other complications of anesthesia, initial encounter: T88.59XA

## 2017-05-08 HISTORY — DX: Adverse effect of unspecified anesthetic, initial encounter: T41.45XA

## 2017-05-08 LAB — CBC
HEMATOCRIT: 44.1 % (ref 36.0–46.0)
HEMOGLOBIN: 14.7 g/dL (ref 12.0–15.0)
MCH: 30.6 pg (ref 26.0–34.0)
MCHC: 33.3 g/dL (ref 30.0–36.0)
MCV: 91.9 fL (ref 78.0–100.0)
PLATELETS: 253 10*3/uL (ref 150–400)
RBC: 4.8 MIL/uL (ref 3.87–5.11)
RDW: 13.9 % (ref 11.5–15.5)
WBC: 10.5 10*3/uL (ref 4.0–10.5)

## 2017-05-08 LAB — SURGICAL PCR SCREEN
MRSA, PCR: NEGATIVE
Staphylococcus aureus: NEGATIVE

## 2017-05-08 LAB — BASIC METABOLIC PANEL
Anion gap: 10 (ref 5–15)
BUN: <5 mg/dL — ABNORMAL LOW (ref 6–20)
CHLORIDE: 101 mmol/L (ref 101–111)
CO2: 25 mmol/L (ref 22–32)
CREATININE: 0.79 mg/dL (ref 0.44–1.00)
Calcium: 8.7 mg/dL — ABNORMAL LOW (ref 8.9–10.3)
GFR calc non Af Amer: >60 mL/min (ref >60)
Glucose, Bld: 84 mg/dL (ref 65–99)
POTASSIUM: 3.9 mmol/L (ref 3.5–5.1)
Sodium: 136 mmol/L (ref 135–145)

## 2017-05-09 NOTE — Progress Notes (Signed)
Anesthesia Chart Review:  Pt is a 46 year old female scheduled for C5-6 cervical disc arthroplasty on 05/16/2017 with Venita Lickahari Brooks, MD  - PCP is Kevin FentonSamuel Bradshaw, MD.  Pt cleared for surgery at initial visit 04/04/17.   PMH includes:  Hyperlipidemia, thyroid disease (s/p thyroidectomy), post-op N/V.  Current smoker. BMI 30. S/p hysterectomy  Medications include: levothyroxine, stelara.   BP (!) 148/78   Pulse 66   Temp 36.4 C   Resp 20   Ht 5\' 3"  (1.6 m)   Wt 170 lb 8 oz (77.3 kg)   SpO2 100%   BMI 30.20 kg/m    Preoperative labs reviewed.    EKG 04/04/17:  - Sinus  Rhythm. Low voltage in precordial leads.  - RSR(V1) -nondiagnostic.   If no changes, I anticipate pt can proceed with surgery as scheduled.   Rica Mastngela Jaisen Wiltrout, FNP-BC [facility]/Anesthesiology Phone: [phone] 05/09/2017 2:34 PM

## 2017-05-13 DIAGNOSIS — Z79899 Other long term (current) drug therapy: Secondary | ICD-10-CM | POA: Diagnosis not present

## 2017-05-13 DIAGNOSIS — L400 Psoriasis vulgaris: Secondary | ICD-10-CM | POA: Diagnosis not present

## 2017-05-16 ENCOUNTER — Ambulatory Visit (HOSPITAL_COMMUNITY): Payer: BLUE CROSS/BLUE SHIELD | Admitting: Anesthesiology

## 2017-05-16 ENCOUNTER — Encounter (HOSPITAL_COMMUNITY)
Admission: RE | Disposition: A | Discharge status: Home / Self Care | Payer: Self-pay | Source: Ambulatory Visit | Attending: Orthopedic Surgery

## 2017-05-16 ENCOUNTER — Observation Stay (HOSPITAL_COMMUNITY)
Admission: RE | Admit: 2017-05-16 | Discharge: 2017-05-17 | Disposition: A | Discharge status: Home / Self Care | Payer: BLUE CROSS/BLUE SHIELD | Source: Ambulatory Visit | Attending: Orthopedic Surgery | Admitting: Orthopedic Surgery

## 2017-05-16 ENCOUNTER — Ambulatory Visit (HOSPITAL_COMMUNITY): Payer: BLUE CROSS/BLUE SHIELD | Admitting: Emergency Medicine

## 2017-05-16 ENCOUNTER — Encounter (HOSPITAL_COMMUNITY): Payer: Self-pay | Admitting: *Deleted

## 2017-05-16 ENCOUNTER — Ambulatory Visit (HOSPITAL_COMMUNITY): Payer: BLUE CROSS/BLUE SHIELD

## 2017-05-16 DIAGNOSIS — G8929 Other chronic pain: Secondary | ICD-10-CM | POA: Diagnosis not present

## 2017-05-16 DIAGNOSIS — Z981 Arthrodesis status: Secondary | ICD-10-CM | POA: Diagnosis not present

## 2017-05-16 DIAGNOSIS — M5441 Lumbago with sciatica, right side: Secondary | ICD-10-CM | POA: Diagnosis not present

## 2017-05-16 DIAGNOSIS — Z8249 Family history of ischemic heart disease and other diseases of the circulatory system: Secondary | ICD-10-CM | POA: Diagnosis not present

## 2017-05-16 DIAGNOSIS — Z833 Family history of diabetes mellitus: Secondary | ICD-10-CM | POA: Insufficient documentation

## 2017-05-16 DIAGNOSIS — Z811 Family history of alcohol abuse and dependence: Secondary | ICD-10-CM | POA: Insufficient documentation

## 2017-05-16 DIAGNOSIS — M5442 Lumbago with sciatica, left side: Secondary | ICD-10-CM | POA: Insufficient documentation

## 2017-05-16 DIAGNOSIS — Z90711 Acquired absence of uterus with remaining cervical stump: Secondary | ICD-10-CM | POA: Diagnosis not present

## 2017-05-16 DIAGNOSIS — Z79890 Hormone replacement therapy (postmenopausal): Secondary | ICD-10-CM | POA: Diagnosis not present

## 2017-05-16 DIAGNOSIS — M50222 Other cervical disc displacement at C5-C6 level: Secondary | ICD-10-CM | POA: Diagnosis not present

## 2017-05-16 DIAGNOSIS — E890 Postprocedural hypothyroidism: Secondary | ICD-10-CM | POA: Insufficient documentation

## 2017-05-16 DIAGNOSIS — F17210 Nicotine dependence, cigarettes, uncomplicated: Secondary | ICD-10-CM | POA: Insufficient documentation

## 2017-05-16 DIAGNOSIS — D509 Iron deficiency anemia, unspecified: Secondary | ICD-10-CM | POA: Diagnosis not present

## 2017-05-16 DIAGNOSIS — Z8261 Family history of arthritis: Secondary | ICD-10-CM | POA: Insufficient documentation

## 2017-05-16 DIAGNOSIS — Z8262 Family history of osteoporosis: Secondary | ICD-10-CM | POA: Diagnosis not present

## 2017-05-16 DIAGNOSIS — Z813 Family history of other psychoactive substance abuse and dependence: Secondary | ICD-10-CM | POA: Insufficient documentation

## 2017-05-16 DIAGNOSIS — E781 Pure hyperglyceridemia: Secondary | ICD-10-CM | POA: Diagnosis not present

## 2017-05-16 DIAGNOSIS — G5603 Carpal tunnel syndrome, bilateral upper limbs: Secondary | ICD-10-CM | POA: Diagnosis not present

## 2017-05-16 DIAGNOSIS — Z823 Family history of stroke: Secondary | ICD-10-CM | POA: Diagnosis not present

## 2017-05-16 DIAGNOSIS — Z79899 Other long term (current) drug therapy: Secondary | ICD-10-CM | POA: Diagnosis not present

## 2017-05-16 DIAGNOSIS — J3801 Paralysis of vocal cords and larynx, unilateral: Secondary | ICD-10-CM | POA: Diagnosis not present

## 2017-05-16 DIAGNOSIS — M5020 Other cervical disc displacement, unspecified cervical region: Secondary | ICD-10-CM | POA: Diagnosis present

## 2017-05-16 DIAGNOSIS — E039 Hypothyroidism, unspecified: Secondary | ICD-10-CM | POA: Diagnosis not present

## 2017-05-16 DIAGNOSIS — E7800 Pure hypercholesterolemia, unspecified: Secondary | ICD-10-CM | POA: Diagnosis not present

## 2017-05-16 DIAGNOSIS — M50122 Cervical disc disorder at C5-C6 level with radiculopathy: Principal | ICD-10-CM | POA: Insufficient documentation

## 2017-05-16 DIAGNOSIS — Z419 Encounter for procedure for purposes other than remedying health state, unspecified: Secondary | ICD-10-CM

## 2017-05-16 HISTORY — PX: CERVICAL DISC ARTHROPLASTY: SHX587

## 2017-05-16 SURGERY — CERVICAL ANTERIOR DISC ARTHROPLASTY
Anesthesia: General | Site: Spine Cervical

## 2017-05-16 MED ORDER — METHOCARBAMOL 500 MG PO TABS
500.0000 mg | ORAL_TABLET | Freq: Four times a day (QID) | ORAL | Status: DC | PRN
  Administered 2017-05-16 – 2017-05-17 (×2): 500 mg via ORAL
  Filled 2017-05-16 (×2): qty 1

## 2017-05-16 MED ORDER — DEXAMETHASONE SODIUM PHOSPHATE 4 MG/ML IJ SOLN: 2.0000 mg | Freq: Four times a day (QID) | INTRAMUSCULAR | Status: AC

## 2017-05-16 MED ORDER — PROPOFOL 10 MG/ML IV BOLUS
INTRAVENOUS | Status: AC
  Filled 2017-05-16: qty 20

## 2017-05-16 MED ORDER — ONDANSETRON HCL 4 MG/2ML IJ SOLN: 4.0000 mg | Freq: Four times a day (QID) | INTRAMUSCULAR | Status: DC | PRN

## 2017-05-16 MED ORDER — ONDANSETRON HCL 4 MG/2ML IJ SOLN
INTRAMUSCULAR | Status: AC
  Filled 2017-05-16: qty 2

## 2017-05-16 MED ORDER — PROMETHAZINE HCL 25 MG/ML IJ SOLN: 6.2500 mg | INTRAMUSCULAR | Status: DC | PRN

## 2017-05-16 MED ORDER — BUPIVACAINE-EPINEPHRINE (PF) 0.25% -1:200000 IJ SOLN
INTRAMUSCULAR | Status: AC
  Filled 2017-05-16: qty 30

## 2017-05-16 MED ORDER — FENTANYL CITRATE (PF) 100 MCG/2ML IJ SOLN: 25.0000 ug | INTRAMUSCULAR | Status: DC | PRN

## 2017-05-16 MED ORDER — DIPHENHYDRAMINE HCL 50 MG/ML IJ SOLN
INTRAMUSCULAR | Status: DC | PRN
  Administered 2017-05-16: 12.5 mg via INTRAVENOUS

## 2017-05-16 MED ORDER — METOCLOPRAMIDE HCL 5 MG/ML IJ SOLN
INTRAMUSCULAR | Status: AC
  Filled 2017-05-16: qty 2

## 2017-05-16 MED ORDER — ROCURONIUM BROMIDE 100 MG/10ML IV SOLN
INTRAVENOUS | Status: DC | PRN
  Administered 2017-05-16: 50 mg via INTRAVENOUS

## 2017-05-16 MED ORDER — PHENOL 1.4 % MT LIQD
1.0000 | OROMUCOSAL | Status: DC | PRN
  Filled 2017-05-16: qty 177

## 2017-05-16 MED ORDER — DEXAMETHASONE SODIUM PHOSPHATE 10 MG/ML IJ SOLN
INTRAMUSCULAR | Status: AC
  Filled 2017-05-16: qty 1

## 2017-05-16 MED ORDER — ROCURONIUM BROMIDE 10 MG/ML (PF) SYRINGE
PREFILLED_SYRINGE | INTRAVENOUS | Status: AC
  Filled 2017-05-16: qty 5

## 2017-05-16 MED ORDER — CEFAZOLIN SODIUM-DEXTROSE 2-4 GM/100ML-% IV SOLN
2.0000 g | Freq: Three times a day (TID) | INTRAVENOUS | Status: AC
  Administered 2017-05-16 – 2017-05-17 (×2): 2 g via INTRAVENOUS
  Filled 2017-05-16 (×2): qty 100

## 2017-05-16 MED ORDER — SODIUM CHLORIDE 0.9% FLUSH: 3.0000 mL | INTRAVENOUS | Status: DC | PRN

## 2017-05-16 MED ORDER — MENTHOL 3 MG MT LOZG: 1.0000 | LOZENGE | OROMUCOSAL | Status: DC | PRN

## 2017-05-16 MED ORDER — LACTATED RINGERS IV SOLN: INTRAVENOUS | Status: DC

## 2017-05-16 MED ORDER — DEXAMETHASONE 4 MG PO TABS
4.0000 mg | ORAL_TABLET | Freq: Four times a day (QID) | ORAL | Status: AC
  Administered 2017-05-16 – 2017-05-17 (×3): 4 mg via ORAL
  Filled 2017-05-16 (×3): qty 1

## 2017-05-16 MED ORDER — SCOPOLAMINE 1 MG/3DAYS TD PT72
MEDICATED_PATCH | TRANSDERMAL | Status: DC | PRN
  Administered 2017-05-16: 1 via TRANSDERMAL

## 2017-05-16 MED ORDER — BUPIVACAINE LIPOSOME 1.3 % IJ SUSP
20.0000 mL | INTRAMUSCULAR | Status: DC
  Filled 2017-05-16: qty 20

## 2017-05-16 MED ORDER — METOCLOPRAMIDE HCL 5 MG/ML IJ SOLN
INTRAMUSCULAR | Status: DC | PRN
  Administered 2017-05-16: 10 mg via INTRAVENOUS

## 2017-05-16 MED ORDER — SCOPOLAMINE 1 MG/3DAYS TD PT72
MEDICATED_PATCH | TRANSDERMAL | Status: AC
  Filled 2017-05-16: qty 1

## 2017-05-16 MED ORDER — BUPIVACAINE-EPINEPHRINE 0.25% -1:200000 IJ SOLN
INTRAMUSCULAR | Status: DC | PRN
  Administered 2017-05-16: 10 mL

## 2017-05-16 MED ORDER — ARTIFICIAL TEARS OPHTHALMIC OINT
TOPICAL_OINTMENT | OPHTHALMIC | Status: DC | PRN
  Administered 2017-05-16: 1 via OPHTHALMIC

## 2017-05-16 MED ORDER — MIDAZOLAM HCL 2 MG/2ML IJ SOLN
INTRAMUSCULAR | Status: AC
  Filled 2017-05-16: qty 2

## 2017-05-16 MED ORDER — LEVOTHYROXINE SODIUM 175 MCG PO TABS
175.0000 ug | ORAL_TABLET | Freq: Every day | ORAL | Status: DC
  Administered 2017-05-17: 175 ug via ORAL
  Filled 2017-05-16: qty 1

## 2017-05-16 MED ORDER — ACETAMINOPHEN 10 MG/ML IV SOLN
INTRAVENOUS | Status: DC | PRN
  Administered 2017-05-16: 1000 mg via INTRAVENOUS

## 2017-05-16 MED ORDER — CEFAZOLIN SODIUM-DEXTROSE 2-4 GM/100ML-% IV SOLN
2.0000 g | INTRAVENOUS | Status: AC
  Administered 2017-05-16: 2 g via INTRAVENOUS

## 2017-05-16 MED ORDER — OXYCODONE-ACETAMINOPHEN 10-325 MG PO TABS: 1.0000 | ORAL_TABLET | Freq: Four times a day (QID) | ORAL | 0 refills | Status: DC | PRN

## 2017-05-16 MED ORDER — ACETAMINOPHEN 650 MG RE SUPP: 650.0000 mg | RECTAL | Status: DC | PRN

## 2017-05-16 MED ORDER — SUGAMMADEX SODIUM 200 MG/2ML IV SOLN
INTRAVENOUS | Status: DC | PRN
  Administered 2017-05-16: 200 mg via INTRAVENOUS

## 2017-05-16 MED ORDER — MIDAZOLAM HCL 2 MG/2ML IJ SOLN
INTRAMUSCULAR | Status: DC | PRN
  Administered 2017-05-16 (×2): 1 mg via INTRAVENOUS

## 2017-05-16 MED ORDER — ONDANSETRON HCL 4 MG PO TABS: 4.0000 mg | ORAL_TABLET | Freq: Three times a day (TID) | ORAL | 0 refills | Status: DC | PRN

## 2017-05-16 MED ORDER — ARTIFICIAL TEARS OPHTHALMIC OINT
TOPICAL_OINTMENT | OPHTHALMIC | Status: AC
  Filled 2017-05-16: qty 3.5

## 2017-05-16 MED ORDER — GLYCOPYRROLATE 0.2 MG/ML IJ SOLN
INTRAMUSCULAR | Status: DC | PRN
  Administered 2017-05-16: 0.2 mg via INTRAVENOUS

## 2017-05-16 MED ORDER — DEXAMETHASONE SODIUM PHOSPHATE 10 MG/ML IJ SOLN
INTRAMUSCULAR | Status: DC | PRN
  Administered 2017-05-16: 10 mg via INTRAVENOUS

## 2017-05-16 MED ORDER — ONDANSETRON HCL 4 MG/2ML IJ SOLN
INTRAMUSCULAR | Status: DC | PRN
  Administered 2017-05-16: 4 mg via INTRAVENOUS

## 2017-05-16 MED ORDER — DEXTROSE 5 % IV SOLN
500.0000 mg | Freq: Four times a day (QID) | INTRAVENOUS | Status: DC | PRN
  Filled 2017-05-16: qty 5

## 2017-05-16 MED ORDER — FENTANYL CITRATE (PF) 250 MCG/5ML IJ SOLN
INTRAMUSCULAR | Status: DC | PRN
  Administered 2017-05-16 (×2): 100 ug via INTRAVENOUS
  Administered 2017-05-16 (×2): 25 ug via INTRAVENOUS

## 2017-05-16 MED ORDER — PROPOFOL 10 MG/ML IV BOLUS
INTRAVENOUS | Status: DC | PRN
  Administered 2017-05-16: 200 mg via INTRAVENOUS

## 2017-05-16 MED ORDER — SODIUM CHLORIDE 0.9% FLUSH
3.0000 mL | Freq: Two times a day (BID) | INTRAVENOUS | Status: DC
  Administered 2017-05-16: 3 mL via INTRAVENOUS

## 2017-05-16 MED ORDER — 0.9 % SODIUM CHLORIDE (POUR BTL) OPTIME
TOPICAL | Status: DC | PRN
  Administered 2017-05-16: 1000 mL

## 2017-05-16 MED ORDER — HEMOSTATIC AGENTS (NO CHARGE) OPTIME
TOPICAL | Status: DC | PRN
  Administered 2017-05-16: 1 via TOPICAL

## 2017-05-16 MED ORDER — OXYCODONE HCL 5 MG PO TABS: 10.0000 mg | ORAL_TABLET | ORAL | Status: DC | PRN

## 2017-05-16 MED ORDER — PROPOFOL 500 MG/50ML IV EMUL
INTRAVENOUS | Status: DC | PRN
  Administered 2017-05-16: 25 ug/kg/min via INTRAVENOUS

## 2017-05-16 MED ORDER — POLYETHYLENE GLYCOL 3350 17 G PO PACK: 17.0000 g | PACK | Freq: Every day | ORAL | Status: DC | PRN

## 2017-05-16 MED ORDER — SUGAMMADEX SODIUM 200 MG/2ML IV SOLN
INTRAVENOUS | Status: AC
  Filled 2017-05-16: qty 2

## 2017-05-16 MED ORDER — CEFAZOLIN SODIUM-DEXTROSE 2-4 GM/100ML-% IV SOLN
INTRAVENOUS | Status: AC
  Filled 2017-05-16: qty 100

## 2017-05-16 MED ORDER — METHOCARBAMOL 500 MG PO TABS: 500.0000 mg | ORAL_TABLET | Freq: Three times a day (TID) | ORAL | 0 refills | Status: DC | PRN

## 2017-05-16 MED ORDER — OXYCODONE HCL 5 MG PO TABS
5.0000 mg | ORAL_TABLET | ORAL | Status: DC | PRN
  Administered 2017-05-16 – 2017-05-17 (×4): 5 mg via ORAL
  Filled 2017-05-16 (×4): qty 1

## 2017-05-16 MED ORDER — FENTANYL CITRATE (PF) 250 MCG/5ML IJ SOLN
INTRAMUSCULAR | Status: AC
Start: 2017-05-16 — End: ?
  Filled 2017-05-16: qty 5

## 2017-05-16 MED ORDER — LIDOCAINE 2% (20 MG/ML) 5 ML SYRINGE
INTRAMUSCULAR | Status: AC
  Filled 2017-05-16: qty 5

## 2017-05-16 MED ORDER — ONDANSETRON HCL 4 MG PO TABS
4.0000 mg | ORAL_TABLET | Freq: Four times a day (QID) | ORAL | Status: DC | PRN
Start: 2017-05-16 — End: 2017-05-17

## 2017-05-16 MED ORDER — LIDOCAINE HCL (CARDIAC) 20 MG/ML IV SOLN
INTRAVENOUS | Status: DC | PRN
  Administered 2017-05-16: 80 mg via INTRATRACHEAL

## 2017-05-16 MED ORDER — USTEKINUMAB 45 MG/0.5ML ~~LOC~~ SOSY: 45.0000 mg | PREFILLED_SYRINGE | Freq: Once | SUBCUTANEOUS | Status: DC

## 2017-05-16 MED ORDER — LACTATED RINGERS IV SOLN
INTRAVENOUS | Status: DC
  Administered 2017-05-16 (×2): via INTRAVENOUS

## 2017-05-16 MED ORDER — ETODOLAC 400 MG PO TABS: 400.0000 mg | ORAL_TABLET | Freq: Every day | ORAL | 0 refills | Status: AC

## 2017-05-16 MED ORDER — DEXTROSE 5 % IV SOLN
INTRAVENOUS | Status: DC | PRN
  Administered 2017-05-16: 25 ug/min via INTRAVENOUS

## 2017-05-16 MED ORDER — DIPHENHYDRAMINE HCL 50 MG/ML IJ SOLN
INTRAMUSCULAR | Status: AC
  Filled 2017-05-16: qty 1

## 2017-05-16 MED ORDER — ACETAMINOPHEN 325 MG PO TABS: 650.0000 mg | ORAL_TABLET | ORAL | Status: DC | PRN

## 2017-05-16 SURGICAL SUPPLY — 61 items
BIT MILLING PRODISC 2.0 STER (BIT) ×2 IMPLANT
BLADE CLIPPER SURG (BLADE) IMPLANT
BUR EGG ELITE 4.0 (BURR) IMPLANT
BUR MATCHSTICK NEURO 3.0 LAGG (BURR) IMPLANT
CANISTER SUCT 3000ML PPV (MISCELLANEOUS) ×2 IMPLANT
CLSR STERI-STRIP ANTIMIC 1/2X4 (GAUZE/BANDAGES/DRESSINGS) ×2 IMPLANT
COVER MAYO STAND STRL (DRAPES) IMPLANT
COVER SURGICAL LIGHT HANDLE (MISCELLANEOUS) ×4 IMPLANT
CRADLE DONUT ADULT HEAD (MISCELLANEOUS) ×2 IMPLANT
DISC PRODISC-C MED 5MM (Neuro Prosthesis/Implant) ×2 IMPLANT
DRAPE C-ARM 42X72 X-RAY (DRAPES) ×2 IMPLANT
DRAPE C-ARMOR (DRAPES) IMPLANT
DRAPE POUCH INSTRU U-SHP 10X18 (DRAPES) ×2 IMPLANT
DRAPE SURG 17X23 STRL (DRAPES) ×2 IMPLANT
DRAPE U-SHAPE 47X51 STRL (DRAPES) ×2 IMPLANT
DRSG MEPILEX BORDER 4X4 (GAUZE/BANDAGES/DRESSINGS) IMPLANT
DRSG OPSITE POSTOP 4X6 (GAUZE/BANDAGES/DRESSINGS) ×2 IMPLANT
DURAPREP 26ML APPLICATOR (WOUND CARE) ×2 IMPLANT
ELECT COATED BLADE 2.86 ST (ELECTRODE) ×2 IMPLANT
ELECT PENCIL ROCKER SW 15FT (MISCELLANEOUS) ×2 IMPLANT
ELECT REM PT RETURN 9FT ADLT (ELECTROSURGICAL) ×2
ELECTRODE REM PT RTRN 9FT ADLT (ELECTROSURGICAL) ×1 IMPLANT
GLOVE BIOGEL PI IND STRL 6.5 (GLOVE) IMPLANT
GLOVE BIOGEL PI IND STRL 8.5 (GLOVE) ×1 IMPLANT
GLOVE BIOGEL PI INDICATOR 6.5 (GLOVE)
GLOVE BIOGEL PI INDICATOR 8.5 (GLOVE) ×1
GLOVE SS BIOGEL STRL SZ 8.5 (GLOVE) ×1 IMPLANT
GLOVE SUPERSENSE BIOGEL SZ 8.5 (GLOVE) ×1
GOWN STRL REUS W/ TWL LRG LVL3 (GOWN DISPOSABLE) ×1 IMPLANT
GOWN STRL REUS W/TWL 2XL LVL3 (GOWN DISPOSABLE) ×4 IMPLANT
GOWN STRL REUS W/TWL LRG LVL3 (GOWN DISPOSABLE) ×1
KIT BASIN OR (CUSTOM PROCEDURE TRAY) ×2 IMPLANT
KIT ROOM TURNOVER OR (KITS) ×2 IMPLANT
NEEDLE SPNL 18GX3.5 QUINCKE PK (NEEDLE) ×2 IMPLANT
NS IRRIG 1000ML POUR BTL (IV SOLUTION) ×4 IMPLANT
NUT RETAINER F/PRODISC (Nut) ×4 IMPLANT
PACK ORTHO CERVICAL (CUSTOM PROCEDURE TRAY) ×2 IMPLANT
PACK UNIVERSAL I (CUSTOM PROCEDURE TRAY) ×2 IMPLANT
PAD ARMBOARD 7.5X6 YLW CONV (MISCELLANEOUS) ×4 IMPLANT
RESTRAINT LIMB HOLDER UNIV (RESTRAINTS) ×2 IMPLANT
SCREW PRODISC RETAINER 3.5X14 (PIN) ×4 IMPLANT
SPONGE INTESTINAL PEANUT (DISPOSABLE) IMPLANT
SPONGE SURGIFOAM ABS GEL 100 (HEMOSTASIS) IMPLANT
SURGIFLO W/THROMBIN 8M KIT (HEMOSTASIS) ×2 IMPLANT
SUT BONE WAX W31G (SUTURE) ×2 IMPLANT
SUT MNCRL AB 3-0 PS2 27 (SUTURE) IMPLANT
SUT MON AB 3-0 SH 27 (SUTURE) ×1
SUT MON AB 3-0 SH27 (SUTURE) ×1 IMPLANT
SUT SILK 3 0 TIES 17X18 (SUTURE)
SUT SILK 3-0 18XBRD TIE BLK (SUTURE) IMPLANT
SUT VIC AB 2-0 CT1 18 (SUTURE) ×2 IMPLANT
SYR 30ML LL (SYRINGE) ×2 IMPLANT
SYR BULB IRRIGATION 50ML (SYRINGE) ×2 IMPLANT
SYR CONTROL 10ML LL (SYRINGE) ×2 IMPLANT
TAPE CLOTH 4X10 WHT NS (GAUZE/BANDAGES/DRESSINGS) ×2 IMPLANT
TAPE UMBILICAL COTTON 1/8X30 (MISCELLANEOUS) ×2 IMPLANT
TIP INSERTER MEDIUM (INSTRUMENTS) ×2 IMPLANT
TOWEL OR 17X24 6PK STRL BLUE (TOWEL DISPOSABLE) ×2 IMPLANT
TOWEL OR 17X26 10 PK STRL BLUE (TOWEL DISPOSABLE) ×2 IMPLANT
TRAY URETHRAL FOLEY CATH 14FR (CATHETERS) ×2 IMPLANT
WATER STERILE IRR 1000ML POUR (IV SOLUTION) IMPLANT

## 2017-05-16 NOTE — Brief Op Note (Signed)
05/16/2017  1:23 PM  PATIENT:  Tricia Mcdonald  46 y.o. 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

## 2017-05-16 NOTE — Progress Notes (Signed)
PT note Evaluation complete.  No home PT f/u or equipment needs.  Full note to follow.  Thanks.  [facility]Qamar Aughenbaugh,PT Acute Rehabilitation [phone] (pager) Eber Jonesawn Jeryn Cerney,PT Acute Rehabilitation [phone] (pager)

## 2017-05-16 NOTE — Transfer of Care (Signed)
Immediate Anesthesia Transfer of Care Note  Patient: Tricia Mcdonald  Procedure(s) Performed: TOTAL DISC REPLACEMENT CERVICAL FIVE THROUGH SIX (N/A Spine Cervical)  Patient Location: PACU  Anesthesia Type:General  Level of Consciousness: awake, alert  and patient cooperative  Airway & Oxygen Therapy: Patient Spontanous Breathing and Patient connected to face mask oxygen  Post-op Assessment: Report given to RN, Post -op Vital signs reviewed and stable, Patient moving all extremities X 4 and Patient able to stick tongue midline  Post vital signs: Reviewed and stable  Last Vitals:  Vitals:   05/16/17 1007 05/16/17 1315  BP: 138/82   Pulse: 70 89  Resp: 20   Temp: 36.6 C (!) 36.2 C  SpO2: 96% 97%    Last Pain:  Vitals:   05/16/17 1012  TempSrc:   PainSc: 0-No pain      Patients Stated Pain Goal: 5 (05/16/17 1012)  Complications: No apparent anesthesia complications

## 2017-05-16 NOTE — Anesthesia Preprocedure Evaluation (Addendum)
Anesthesia Evaluation  Patient identified by MRN, date of birth, ID band Patient awake    Reviewed: Allergy & Precautions, NPO status , Patient's Chart, lab work & pertinent test results  History of Anesthesia Complications (+) PONV  Airway Mallampati: II  TM Distance: >3 FB Neck ROM: Full    Dental  (+) Dental Advisory Given   Pulmonary Current Smoker,    Pulmonary exam normal breath sounds clear to auscultation       Cardiovascular negative cardio ROS Normal cardiovascular exam Rhythm:Regular Rate:Normal     Neuro/Psych negative neurological ROS  negative psych ROS   GI/Hepatic negative GI ROS, Neg liver ROS,   Endo/Other  Hypothyroidism   Renal/GU negative Renal ROS  negative genitourinary   Musculoskeletal  (+) Arthritis , Mild intermittent radiation of pain from neck to right arm with various movements   Abdominal   Peds  Hematology negative hematology ROS (+)   Anesthesia Other Findings   Reproductive/Obstetrics                            Anesthesia Physical Anesthesia Plan  ASA: II  Anesthesia Plan: General   Post-op Pain Management:    Induction: Intravenous  PONV Risk Score and Plan: 4 or greater and Treatment may vary due to age or medical condition, Ondansetron, Dexamethasone, Midazolam and Scopolamine patch - Pre-op  Airway Management Planned: Oral ETT  Additional Equipment: None  Intra-op Plan:   Post-operative Plan: Extubation in OR  Informed Consent: I have reviewed the patients History and Physical, chart, labs and discussed the procedure including the risks, benefits and alternatives for the proposed anesthesia with the patient or authorized representative who has indicated his/her understanding and acceptance.   Dental advisory given  Plan Discussed with: CRNA  Anesthesia Plan Comments:         Anesthesia Quick Evaluation

## 2017-05-16 NOTE — Evaluation (Signed)
Physical Therapy Evaluation and D/C Patient Details Name: Tricia BroadKarrie S Espin MRN: [id number] DOB: 1970/11/27 Today's Date: 05/16/2017   History of Present Illness  The patient is a 46 year old female who presents for a follow-up for H & P. The patient is scheduled for a TDR C5-6 to be performed by Dr. Debria Garretahari D. Shon BatonBrooks, [facility] on 05-16-17 . Pt reports smoking a pack of cigarettes a day.   Total disc arthroplasty C5-C6.    Clinical Impression  Pt admitted with above diagnosis. Pt currently without significant functional limitations and does not need assist with ambulation.  Education regarding neck precautions discussed.  Pt husband will provide supervision of pt on d/c.  No further skilled PT needs at this time.  Will sign off.  Follow Up Recommendations No PT follow up    Equipment Recommendations  None recommended by PT    Recommendations for Other Services       Precautions / Restrictions Precautions Precautions: Fall;Cervical Precaution Comments: Neck precautions reviewed with handout discussed at length with pt and husband. Required Braces or Orthoses: Cervical Brace Cervical Brace: Soft collar;At all times Restrictions Weight Bearing Restrictions: No      Mobility  Bed Mobility Overal bed mobility: Independent             General bed mobility comments: Taught pt and husband proper technique  Transfers Overall transfer level: Independent                  Ambulation/Gait Ambulation/Gait assistance: Independent Ambulation Distance (Feet): 450 Feet Assistive device: None Gait Pattern/deviations: Step-through pattern;Decreased stride length   Gait velocity interpretation: <1.8 ft/sec, indicative of risk for recurrent falls General Gait Details: No LOB.  Pt ambulating well and does not need assist.    Stairs Stairs: Yes Stairs assistance: Supervision Stair Management: One rail Right;Alternating pattern;Forwards Number of Stairs:  4 General stair comments: No issues with steps.    Wheelchair Mobility    Modified Rankin (Stroke Patients Only)       Balance Overall balance assessment: Independent                                           Pertinent Vitals/Pain Pain Assessment: 0-10 Pain Score: 2  Pain Location: neck Pain Descriptors / Indicators: Aching;Discomfort Pain Intervention(s): Limited activity within patient's tolerance;Monitored during session;Premedicated before session;Repositioned    Home Living Family/patient expects to be discharged to:: Private residence Living Arrangements: Spouse/significant other Available Help at Discharge: Family;Available 24 hours/day Type of Home: House Home Access: Stairs to enter Entrance Stairs-Rails: Right Entrance Stairs-Number of Steps: 4 Home Layout: One level Home Equipment: None      Prior Function Level of Independence: Independent               Hand Dominance        Extremity/Trunk Assessment   Upper Extremity Assessment Upper Extremity Assessment: RUE deficits/detail;LUE deficits/detail RUE Deficits / Details: NT above 90 degrees RUE Sensation: decreased light touch LUE Deficits / Details: NT above 90 degrees  LUE Sensation: decreased light touch    Lower Extremity Assessment Lower Extremity Assessment: Generalized weakness    Cervical / Trunk Assessment Cervical / Trunk Assessment: Normal  Communication   Communication: No difficulties  Cognition Arousal/Alertness: Awake/alert Behavior During Therapy: WFL for tasks assessed/performed Overall Cognitive Status: Within Functional Limits for tasks assessed  General Comments General comments (skin integrity, edema, etc.): soft collar in place    Exercises     Assessment/Plan    PT Assessment Patent does not need any further PT services  PT Problem List         PT Treatment Interventions       PT Goals (Current goals can be found in the Care Plan section)  Acute Rehab PT Goals Patient Stated Goal: to go home PT Goal Formulation: All assessment and education complete, DC therapy    Frequency     Barriers to discharge        Co-evaluation               AM-PAC PT "6 Clicks" Daily Activity  Outcome Measure Difficulty turning over in bed (including adjusting bedclothes, sheets and blankets)?: None Difficulty moving from lying on back to sitting on the side of the bed? : None Difficulty sitting down on and standing up from a chair with arms (e.g., wheelchair, bedside commode, etc,.)?: None Help needed moving to and from a bed to chair (including a wheelchair)?: None Help needed walking in hospital room?: None Help needed climbing 3-5 steps with a railing? : None 6 Click Score: 24    End of Session Equipment Utilized During Treatment: Gait belt;Cervical collar Activity Tolerance: Patient tolerated treatment well Patient left: in bed;with call bell/phone within reach;with family/visitor present;with SCD's reapplied Nurse Communication: Mobility status PT Visit Diagnosis: Muscle weakness (generalized) (M62.81)    Time: 1610-1640 PT Time Calculation (min) (ACUTE ONLY): 30 min   Charges:   PT Evaluation $PT Eval Low Complexity: 1 Low PT Treatments $Gait Training: 8-22 mins   PT G Codes:   PT G-Codes **NOT FOR INPATIENT CLASS** Functional Assessment Tool Used: AM-PAC 6 Clicks Basic Mobility Functional Limitation: Mobility: Walking and moving around Mobility: Walking and Moving Around Current Status (H8469(G8978): 0 percent impaired, limited or restricted Mobility: Walking and Moving Around Goal Status (G2952(G8979): 0 percent impaired, limited or restricted Mobility: Walking and Moving Around Discharge Status (W4132(G8980): 0 percent impaired, limited or restricted    [facility] Azhane Eckart,PT Acute Rehabilitation [phone] (pager)   Berline Lopesawn F Kelbie Moro 05/16/2017, 9:57  PM

## 2017-05-16 NOTE — H&P (Signed)
History of Present Illness The patient is a 46 year old female who presents for a follow-up for H & P. The patient is scheduled for a TDR C5-6 to be performed by Dr. Debria Garretahari D. Shon BatonBrooks, MD at [facility] on 05-16-17 . Pt reports smoking a pack of cigarettes a day.   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 Diabetes Mellitus  Father. Drug / Alcohol Addiction  Brother. Hypertension  Father. Osteoarthritis  Maternal Grandmother. Osteoporosis  Maternal Grandmother.  Social History Tobacco / smoke exposure  Tobacco use  Current every day smoker. Children  3 Current work status  working full time Exercise  Exercises monthly; does running / walking and other Former drinker  12/01/2015: In the past drank hard liquor only occasionally per week Living situation  live alone Marital status  divorced No history of drug/alcohol rehab  Not under pain contract  Number of flights of stairs before winded  2-3  Medication History Percocet (10-325MG  Tablet, 1 (one) Oral four times daily, as needed, Taken starting 03/31/2017) Active. Levothyroxine Sodium (175MCG Tablet, Oral) Active. Stelara (90MG /ML Solution, Subcutaneous) Active. (04-14-17 WILL HAVE ANOTHER INJECTION 05-12-17) Medications Reconciled  Past Surgical History Hysterectomy  partial (cancerous) Thyroidectomy; Total   Other Problems  Anemia  Hypercholesterolemia  Hypothyroidism    Vitals  05/02/2017 8:42 AM Weight: 174 lb Height: 63in Body Surface Area: 1.82 m Body Mass Index: 30.82 kg/m  Temp.: 97.78F(Oral)  Pulse: 70 (Regular)  BP: 154/91 (Sitting, Left Arm, Standard) General General Appearance-Not in acute distress. Orientation-Oriented X3. Build & Nutrition-Well nourished and Well developed.  Integumentary General Characteristics Surgical Scars - no surgical scar evidence of previous cervical surgery. Cervical Spine-Skin examination of the cervical spine is without deformity, skin lesions, lacerations or abrasions.  Chest and Lung Exam Auscultation Breath sounds - Normal and Clear.  Cardiovascular Auscultation Rhythm - Regular rate and rhythm.  Peripheral Vascular Upper Extremity Palpation - Radial pulse - Bilateral - 2+.  Neurologic Sensation Upper Extremity - Bilateral - sensation is intact in the upper extremity. Reflexes Biceps Reflex - Bilateral - 2+. Brachioradialis Reflex - Bilateral - 2+. Triceps Reflex - Bilateral - 2+. Hoffman's Sign - Bilateral - Hoffman's sign not present.  Musculoskeletal Spine/Ribs/Pelvis  Cervical Spine : Inspection and Palpation - Tenderness - right cervical paraspinals tender to palpation, left cervical paraspinals tender to palpation and right trapezius tender to palpation. Strength and Tone: Strength: Strength: Strength - Deltoid - Bilateral - 5/5. Right - 3/5. Biceps - Left - 5/5. Bilateral - 5/5. Triceps - Bilateral - 5/5. Wrist Extension - Left - 5/5. Right - 3-/5. Bilateral - 5/5. Hand Grip - Bilateral - 3/5. Heel walk - Bilateral - able to heel walk without difficulty. Toe Walk - Bilateral - able to walk on toes without difficulty. Heel-Toe Walk - Bilateral - able to heel-toe walk without difficulty. ROM - Flexion - Moderately Decreased and painful. Extension - Moderately Decreased and painful. Left Lateral Flexion - Moderately Decreased and painful. Right Lateral Flexion -  Moderately Decreased and painful. Left Rotation -  Moderately Decreased and painful. Right Rotation - Moderately Decreased and painful. Pain - neither flexion or extension is more painful than the other. Cervical Spine - Special Testing - axial compression test negative, cross chest impingement test negative. Non-Anatomic Signs - No non-anatomic signs present. Upper Extremity Range of Motion - No truesholder pain with IR/ER of the shoulders.  Cervical MRI from September 2018 no cord signal changes large right posterior lateral disc herniation at 5 6 with compression of not only the exiting C6 nerve root but also traversing C7 mild degenerative changes in other levels  Assessment & Plan Goal Of Surgery: Discussed that goal of surgery is to reduce pain and improve function and quality of life. Patient is aware that despite all appropriate treatment that there pain and function could be the same, worse, or different.  Anterior cervical fusion:Risks of surgery include, but are not limited to: Throat pain, swallowing difficulty, hoarseness or change in voice, death, stroke, paralysis, nerve root damage/injury, bleeding, blood clots, loss of bowel/bladder control, hardware failure, or mal-position, spinal fluid leak, adjacent segment disease, non-union, need for further surgery, ongoing or worse pain, infection. Post-operative bleeding or swelling that could require emergent surgery.  Patient continues to have significant radicular arm pain, and weakness.  Despite appropriate conservative management her quality of life continues to deteriorate.  There is been no change in her clinical exam since her last office visit.  I have again discussed the overall procedure with the patient including the fact that if for technical reasons the disc is not properly fitting we may need to be allowed to a fusion.  All of her questions were encouraged and addressed.  Patient has pre-existing right vocal cord paralysis  secondary to a prior thyroidectomy. As a result I will do the approach today from the right side.

## 2017-05-16 NOTE — Anesthesia Postprocedure Evaluation (Signed)
Anesthesia Post Note  Patient: Suella BroadKarrie S Coakley  Procedure(s) Performed: TOTAL DISC REPLACEMENT CERVICAL FIVE THROUGH SIX (N/A Spine Cervical)     Patient location during evaluation: PACU Anesthesia Type: General Level of consciousness: awake and alert Pain management: pain level controlled Vital Signs Assessment: post-procedure vital signs reviewed and stable Respiratory status: spontaneous breathing, nonlabored ventilation and respiratory function stable Cardiovascular status: blood pressure returned to baseline and stable Postop Assessment: no apparent nausea or vomiting Anesthetic complications: no    Last Vitals:  Vitals:   05/16/17 1430 05/16/17 1441  BP:  134/81  Pulse:  66  Resp:  16  Temp: (!) 36.4 C 36.7 C  SpO2: 92% 99%    Last Pain:  Vitals:   05/16/17 1430  TempSrc:   PainSc: 0-No pain      LLE Sensation: Full sensation (05/16/17 1500)   RLE Sensation: No numbness;No tingling;No pain (05/16/17 1500)      Beryle Lathehomas E Alleyah Twombly

## 2017-05-16 NOTE — Discharge Instructions (Signed)

## 2017-05-16 NOTE — Anesthesia Procedure Notes (Signed)
Procedure Name: Intubation Date/Time: 05/16/2017 10:48 AM Performed by: Beryle LatheBrock, Thomas E, MD Pre-anesthesia Checklist: Patient identified, Emergency Drugs available, Suction available and Patient being monitored Patient Re-evaluated:Patient Re-evaluated prior to induction Oxygen Delivery Method: Circle system utilized Preoxygenation: Pre-oxygenation with 100% oxygen Induction Type: IV induction Ventilation: Mask ventilation without difficulty Laryngoscope Size: Glidescope (T3) Grade View: Grade I Tube type: Oral Number of attempts: 1 Airway Equipment and Method: Video-laryngoscopy Placement Confirmation: ETT inserted through vocal cords under direct vision,  positive ETCO2 and breath sounds checked- equal and bilateral Secured at: 22 cm Tube secured with: Tape Dental Injury: Teeth and Oropharynx as per pre-operative assessment  Difficulty Due To: Difficulty was anticipated and Difficult Airway- due to reduced neck mobility

## 2017-05-16 NOTE — Op Note (Signed)
Operative report.  Preoperative diagnosis.  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  Postoperative diagnosis.  Same.  Operative procedure.  Total disc arthroplasty C5-6.  Implant: [place] Prodisc C.  Size 5 small.  Complications.  None.  Indications.  This is a very pleasant 46 year old gentleman who has had progressive neck and radicular arm pain and weakness.  Attempts at conservative management failed to alleviate her pain.  Imaging studies confirmed a large disc herniation with significant right C6 radicular weakness.  As a result of the clinical findings we elected to proceed with surgery.  All appropriate risks benefits and alternatives were discussed and consent was obtained.  Operative report.  Patient was brought to the operating room placed upon the operating room table.  After successful induction of general anesthesia and endotracheal intubation teds SCDs were applied and the anterior cervical spine was prepped and draped in a standard fashion.  Timeout was taken to confirm patient procedure and all other important data.  X-ray was then used to identify the C5-6 disc space and the skin was marked.  This incision site was infiltrated with quarter percent Marcaine.  A midline incision was made starting from the midline and proceeding to the right.  Sharp dissection was carried out down to and through the platysma.  Standard Smith-Robinson approach to the anterior cervical spine via right-sided approach was then taken.  I sharply dissected through the deep cervical and prevertebral fascia.  I swept the esophagus trachea to the left and protected with an appendiceal retractor.  Identified the carotid pulse and protected that with a finger.  I now had visualization the anterior cervical spine.  Using Kitner dissectors I remove the remaining prevertebral fascia to completely expose the level.  A needle was placed into the C5-6 disc space and x-ray was taken confirming I was at the  appropriate level.  I mobilized the longus coli muscles from the mid body of C5 to the mid body of C6 I then placed the self-retaining retractors underneath the longus coli muscle deflated the endotracheal cuff and then expanded the retractor to the appropriate width.  I then moved forward with the discectomy.  An annulotomy was performed with a 15 blade scapula and I began removing the disc material with my pituitary rongeur.  I then placed distraction pins into the body of C6 and C5.  Using the AP plane I confirmed that they were in the midline.  I distracted the intervertebral space and maintain the distraction with the pin set.  I continued my posterior discectomy.  I then removed with my micropituitary rongeur back to large fragments of disc material from the posterior lateral right corner.  This was consistent with what was seen on the preoperative MRI.  I continued to dissect posteriorly and then used my 1 mm Kerrison rongeur to remove the posterior osteophyte from the vertebral body of C5 and C6 I then used my nerve hook to sweep in the posterior lateral corner removing other small fragments of disc material.  I then was able to make a plane underneath the posterior longitudinal ligament and resect the PLL with my 1 mm Kerrison rongeur.  At this point I could then get under the uncovertebral joint and decompress.  Spent the bulk of the time on the right-hand side as this was her symptomatic side.  At the conclusion of the decompression I could easily sweep my nerve hook behind the posterior bodies of C5 and C6 and out into the uncovertebral joint  right side.  There were no further fragments of disc material.  At this point I trialed the intervertebral spacers and like to use a size 5 small.  Once it was secured into place and then used a high-speed bur to create the 2 fin cuts.  I then obtained the implant and malleted to the appropriate depth.  I removed the distraction pin sites and all the bony holes  were then plugged with bone wax.  I irrigated copiously with normal saline and then using bipolar electrocautery and FloSeal obtained and maintain hemostasis.  At this point I was pleased with the decompression and hardware position.  The esophagus and trachea returned to midline and final x-rays were taken.  Implant was midline and properly positioned in both planes.  The platysma was then closed with interrupted 2-0 Vicryl suture and the skin with 3-0 Monocryl.  Steri-Strips dry dressing and a soft collar were applied.  Patient was then transferred the PACU without incident.

## 2017-05-17 DIAGNOSIS — M50122 Cervical disc disorder at C5-C6 level with radiculopathy: Secondary | ICD-10-CM | POA: Diagnosis not present

## 2017-05-17 DIAGNOSIS — Z79899 Other long term (current) drug therapy: Secondary | ICD-10-CM | POA: Diagnosis not present

## 2017-05-17 DIAGNOSIS — M5442 Lumbago with sciatica, left side: Secondary | ICD-10-CM | POA: Diagnosis not present

## 2017-05-17 DIAGNOSIS — E890 Postprocedural hypothyroidism: Secondary | ICD-10-CM | POA: Diagnosis not present

## 2017-05-17 DIAGNOSIS — Z833 Family history of diabetes mellitus: Secondary | ICD-10-CM | POA: Diagnosis not present

## 2017-05-17 DIAGNOSIS — G5603 Carpal tunnel syndrome, bilateral upper limbs: Secondary | ICD-10-CM | POA: Diagnosis not present

## 2017-05-17 DIAGNOSIS — M5441 Lumbago with sciatica, right side: Secondary | ICD-10-CM | POA: Diagnosis not present

## 2017-05-17 DIAGNOSIS — F17210 Nicotine dependence, cigarettes, uncomplicated: Secondary | ICD-10-CM | POA: Diagnosis not present

## 2017-05-17 DIAGNOSIS — E7800 Pure hypercholesterolemia, unspecified: Secondary | ICD-10-CM | POA: Diagnosis not present

## 2017-05-17 DIAGNOSIS — Z823 Family history of stroke: Secondary | ICD-10-CM | POA: Diagnosis not present

## 2017-05-17 DIAGNOSIS — Z811 Family history of alcohol abuse and dependence: Secondary | ICD-10-CM | POA: Diagnosis not present

## 2017-05-17 DIAGNOSIS — J3801 Paralysis of vocal cords and larynx, unilateral: Secondary | ICD-10-CM | POA: Diagnosis not present

## 2017-05-17 DIAGNOSIS — G8929 Other chronic pain: Secondary | ICD-10-CM | POA: Diagnosis not present

## 2017-05-17 DIAGNOSIS — Z813 Family history of other psychoactive substance abuse and dependence: Secondary | ICD-10-CM | POA: Diagnosis not present

## 2017-05-17 DIAGNOSIS — Z79890 Hormone replacement therapy (postmenopausal): Secondary | ICD-10-CM | POA: Diagnosis not present

## 2017-05-17 DIAGNOSIS — Z90711 Acquired absence of uterus with remaining cervical stump: Secondary | ICD-10-CM | POA: Diagnosis not present

## 2017-05-17 MED ORDER — POLYETHYLENE GLYCOL 3350 17 G PO PACK: 17.0000 g | PACK | Freq: Every day | ORAL | 0 refills | Status: DC | PRN

## 2017-05-17 NOTE — Progress Notes (Signed)
Patient alert and oriented, mae's well, voiding adequate amount of urine, swallowing without difficulty, no c/o pain at time of discharge. Patient discharged home with family. Script and discharged instructions given to patient. Patient and family stated understanding of instructions given. Patient has an appointment with Dr. Brooks  

## 2017-05-17 NOTE — Discharge Summary (Signed)
Patient ID: Tricia Mcdonald Campoverde MRN: [id number] DOB/AGE: 48/30/1972 48 y.o.  Admit date: 05/16/2017 Discharge date: 05/17/2017  Admission Diagnoses:  Active Problems:   Cervical disc herniation   Discharge Diagnoses:  Same  Past Medical History:  Diagnosis Date  . Complication of anesthesia   . Hyperlipidemia   . PONV (postoperative nausea and vomiting)   . Thyroid disease     Surgeries: Procedure(Mcdonald): TOTAL DISC REPLACEMENT CERVICAL FIVE THROUGH SIX on 05/16/2017   Consultants:   Discharged Condition: Improved  Hospital Course: Tricia Mcdonald Moomaw is an 46 y.o. female who was admitted 05/16/2017 for operative treatment of<principal problem not specified>. Patient has severe unremitting pain that affects sleep, daily activities, and work/hobbies. After pre-op clearance the patient was taken to the operating room on 05/16/2017 and underwent  Procedure(Mcdonald): TOTAL DISC REPLACEMENT CERVICAL FIVE THROUGH SIX.    Patient was given perioperative antibiotics:  Anti-infectives (From admission, onward)   Start     Dose/Rate Route Frequency Ordered Stop   05/16/17 1800  ceFAZolin (ANCEF) IVPB 2g/100 mL premix     2 g 200 mL/hr over 30 Minutes Intravenous Every 8 hours 05/16/17 1447 05/17/17 0236   05/16/17 0824  ceFAZolin (ANCEF) 2-4 GM/100ML-% IVPB    Comments:  Forte, Lindsi   : cabinet override      05/16/17 0824 05/16/17 1100   05/16/17 0822  ceFAZolin (ANCEF) IVPB 2g/100 mL premix     2 g 200 mL/hr over 30 Minutes Intravenous 30 min pre-op 05/16/17 [id number] 05/16/17 1100       Patient was given sequential compression devices, early ambulation, and chemoprophylaxis to prevent DVT.  Patient benefited maximally from hospital stay and there were no complications.    Recent vital signs:  Patient Vitals for the past 24 hrs:  BP Temp Temp src Pulse Resp SpO2 Weight  05/17/17 0811 115/73 98.5 F (36.9 C) - (!) 57 16 95 % -  05/17/17 0351 (!) 108/59 98 F (36.7 C) Oral (!) 56 16 96 % -   05/16/17 2346 119/63 98.3 F (36.8 C) Oral 60 16 97 % -  05/16/17 2000 121/73 98.2 F (36.8 C) Oral 66 16 96 % -  05/16/17 1705 122/64 98.1 F (36.7 C) - 75 16 97 % -  05/16/17 1441 134/81 98 F (36.7 C) - 66 16 99 % -  05/16/17 1430 - (!) 97.5 F (36.4 C) - - - 92 % -  05/16/17 1418 116/70 - - 70 14 (!) 88 % -  05/16/17 1415 - - - 61 14 91 % -  05/16/17 1404 112/72 - - 67 16 94 % -  05/16/17 1400 - - - 77 16 95 % -  05/16/17 1348 127/77 - - 78 15 93 % -  05/16/17 1345 - - - 88 16 91 % -  05/16/17 1334 124/70 - - 90 (!) 21 93 % -  05/16/17 1330 - - - 71 13 95 % -  05/16/17 1319 133/71 - - 85 17 96 % -  05/16/17 1315 - (!) 97.2 F (36.2 C) - 89 - 97 % -  05/16/17 1007 138/82 97.9 F (36.6 C) Oral 70 20 96 % 77.1 kg (170 lb)     Recent laboratory studies: No results for input(Mcdonald): WBC, HGB, HCT, PLT, NA, K, CL, CO2, BUN, CREATININE, GLUCOSE, INR, CALCIUM in the last 72 hours.  Invalid input(Mcdonald): PT, 2   Discharge Medications:   Allergies as of 05/17/2017   No Known Allergies  Medication List    STOP taking these medications   ibuprofen 200 MG tablet Commonly known as:  ADVIL,MOTRIN     TAKE these medications   etodolac 400 MG tablet Commonly known as:  LODINE Take 1 tablet (400 mg total) by mouth daily. Start taking on:  05/23/2017   levothyroxine 175 MCG tablet Commonly known as:  SYNTHROID, LEVOTHROID Take 175 mcg by mouth daily before breakfast.   methocarbamol 500 MG tablet Commonly known as:  ROBAXIN Take 1 tablet (500 mg total) by mouth 3 (three) times daily as needed for muscle spasms.   ondansetron 4 MG tablet Commonly known as:  ZOFRAN Take 1 tablet (4 mg total) by mouth every 8 (eight) hours as needed for nausea or vomiting.   oxyCODONE-acetaminophen 10-325 MG tablet Commonly known as:  PERCOCET Take 1 tablet by mouth every 6 (six) hours as needed for pain. What changed:  when to take this   Pitavastatin Calcium 1 MG Tabs Commonly known as:   LIVALO Take 1 tablet (1 mg total) daily by mouth.   polyethylene glycol packet Commonly known as:  MIRALAX / GLYCOLAX Take 17 g by mouth daily as needed for mild constipation.   ustekinumab 90 MG/ML Sosy injection Commonly known as:  STELARA Inject 90 mg into the skin once.   ustekinumab 45 MG/0.5ML Sosy injection Commonly known as:  STELARA Inject 45 mg into the skin once.       Diagnostic Studies: Dg Cervical Spine 2-3 Views  Result Date: 05/16/2017 CLINICAL DATA:  Surgery: C5-C6 disc arthroplasty Fluoro time: 38 seconds EXAM: CERVICAL SPINE - 2-3 VIEW; DG C-ARM 61-120 MIN COMPARISON:  Plain film of the cervical spine dated 01/09/2017. FINDINGS: Intraoperative fluoroscopic AP and lateral views of the cervical spine are provided. These fluoroscopic images show new hardware at the C5-C6 disc space, appropriately positioned. Osseous alignment appears anatomic. Fluoroscopy was provided for 38 seconds. IMPRESSION: Intraoperative fluoroscopic demonstrating new hardware at the C5-6 disc space. No evidence of surgical complicating feature. Electronically Signed   By: Bary RichardStan  Maynard M.D.   On: 05/16/2017 13:01   Dg C-arm 61-120 Min  Result Date: 05/16/2017 CLINICAL DATA:  Surgery: C5-C6 disc arthroplasty Fluoro time: 38 seconds EXAM: CERVICAL SPINE - 2-3 VIEW; DG C-ARM 61-120 MIN COMPARISON:  Plain film of the cervical spine dated 01/09/2017. FINDINGS: Intraoperative fluoroscopic AP and lateral views of the cervical spine are provided. These fluoroscopic images show new hardware at the C5-C6 disc space, appropriately positioned. Osseous alignment appears anatomic. Fluoroscopy was provided for 38 seconds. IMPRESSION: Intraoperative fluoroscopic demonstrating new hardware at the C5-6 disc space. No evidence of surgical complicating feature. Electronically Signed   By: Bary RichardStan  Maynard M.D.   On: 05/16/2017 13:01    Disposition:   Discharge Instructions    Call MD / Call 911   Complete by:  As  directed    If you experience chest pain or shortness of breath, CALL 911 and be transported to the hospital emergency room.  If you develope a fever above 101 F, pus (white drainage) or increased drainage or redness at the wound, or calf pain, call your surgeon'[facility] office.   Constipation Prevention   Complete by:  As directed    Drink plenty of fluids.  Prune juice may be helpful.  You may use a stool softener, such as Colace (over the counter) 100 mg twice a day.  Use MiraLax (over the counter) for constipation as needed.   Diet - low sodium heart healthy  Complete by:  As directed    Incentive spirometry RT   Complete by:  As directed    Increase activity slowly as tolerated   Complete by:  As directed       Follow-up Information    Venita Lick, MD In 1 week.   Specialty:  Orthopedic Surgery Why:  If symptoms worsen, For suture removal, For wound re-check Contact information: [address] [place] [address] [phone]            Signed: Dorothy Spark. 05/17/2017, 8:22 AM

## 2017-05-17 NOTE — Progress Notes (Signed)
Subjective: 1 Day Post-Op Procedure(s) (LRB): TOTAL DISC REPLACEMENT CERVICAL FIVE THROUGH SIX (N/A) Patient reports pain as mild. Reports mild incisional pain. Nerve pain resolved R arm. No other c/o. Feels ready to go home. + Flatus.    Objective: Vital signs in last 24 hours: Temp:  [97.2 F (36.2 C)-98.5 F (36.9 C)] 98.5 F (36.9 C) (12/22 0811) Pulse Rate:  [56-90] 57 (12/22 0811) Resp:  [13-21] 16 (12/22 0811) BP: (108-138)/(59-82) 115/73 (12/22 0811) SpO2:  [88 %-99 %] 95 % (12/22 0811) Weight:  [77.1 kg (170 lb)] 77.1 kg (170 lb) (12/21 1007)  Intake/Output from previous day: 12/21 0701 - 12/22 0700 In: 1500 [I.V.:1500] Out: 275 [Urine:250; Blood:25] Intake/Output this shift: No intake/output data recorded.  No results for input(s): HGB in the last 72 hours. No results for input(s): WBC, RBC, HCT, PLT in the last 72 hours. No results for input(s): NA, K, CL, CO2, BUN, CREATININE, GLUCOSE, CALCIUM in the last 72 hours. No results for input(s): LABPT, INR in the last 72 hours.  Neurologically intact ABD soft Neurovascular intact Sensation intact distally Intact pulses distally Dorsiflexion/Plantar flexion intact Incision: dressing C/D/I and no drainage No cellulitis present Compartment soft no calf pain or sign of DVT  Assessment/Plan: 1 Day Post-Op Procedure(s) (LRB): TOTAL DISC REPLACEMENT CERVICAL FIVE THROUGH SIX (N/A) Advance diet Up with therapy D/C IV fluids  Discussed D/C instructions, use of collar D/C home today  Vaness Jelinski M. 05/17/2017, 8:20 AM

## 2017-05-19 ENCOUNTER — Encounter (HOSPITAL_COMMUNITY): Payer: Self-pay | Admitting: Orthopedic Surgery

## 2017-05-29 ENCOUNTER — Telehealth: Payer: Self-pay | Admitting: Family Medicine

## 2017-05-29 MED ORDER — PRAVASTATIN SODIUM 20 MG PO TABS: 20.0000 mg | ORAL_TABLET | Freq: Every day | ORAL | 0 refills | Status: AC

## 2017-05-29 NOTE — Telephone Encounter (Signed)
Rx changed to pravastatin which is not as strong but is equally well tolerated.   Murtis SinkSam Lauris Keepers, MD [facility] 05/29/2017, 5:43 PM

## 2017-05-30 NOTE — Telephone Encounter (Signed)
Aware of new medicine. 

## 2017-06-27 DIAGNOSIS — M5022 Other cervical disc displacement, mid-cervical region, unspecified level: Secondary | ICD-10-CM | POA: Diagnosis not present

## 2017-07-01 DIAGNOSIS — M542 Cervicalgia: Secondary | ICD-10-CM | POA: Diagnosis not present

## 2017-07-17 DIAGNOSIS — M542 Cervicalgia: Secondary | ICD-10-CM | POA: Diagnosis not present

## 2017-07-22 DIAGNOSIS — Z7189 Other specified counseling: Secondary | ICD-10-CM | POA: Diagnosis not present

## 2017-07-22 DIAGNOSIS — F418 Other specified anxiety disorders: Secondary | ICD-10-CM | POA: Diagnosis not present

## 2017-07-22 DIAGNOSIS — Z1321 Encounter for screening for nutritional disorder: Secondary | ICD-10-CM | POA: Diagnosis not present

## 2017-07-22 DIAGNOSIS — Z1231 Encounter for screening mammogram for malignant neoplasm of breast: Secondary | ICD-10-CM | POA: Diagnosis not present

## 2017-07-22 DIAGNOSIS — Z713 Dietary counseling and surveillance: Secondary | ICD-10-CM | POA: Diagnosis not present

## 2017-07-22 DIAGNOSIS — Z111 Encounter for screening for respiratory tuberculosis: Secondary | ICD-10-CM | POA: Diagnosis not present

## 2017-07-22 DIAGNOSIS — E039 Hypothyroidism, unspecified: Secondary | ICD-10-CM | POA: Diagnosis not present

## 2017-07-22 DIAGNOSIS — Z136 Encounter for screening for cardiovascular disorders: Secondary | ICD-10-CM | POA: Diagnosis not present

## 2017-08-05 DIAGNOSIS — Z713 Dietary counseling and surveillance: Secondary | ICD-10-CM | POA: Diagnosis not present

## 2017-08-05 DIAGNOSIS — Z7189 Other specified counseling: Secondary | ICD-10-CM | POA: Diagnosis not present

## 2017-08-05 DIAGNOSIS — E039 Hypothyroidism, unspecified: Secondary | ICD-10-CM | POA: Diagnosis not present

## 2017-08-08 DIAGNOSIS — M50122 Cervical disc disorder at C5-C6 level with radiculopathy: Secondary | ICD-10-CM | POA: Diagnosis not present

## 2017-08-13 DIAGNOSIS — Z79899 Other long term (current) drug therapy: Secondary | ICD-10-CM | POA: Diagnosis not present

## 2017-08-13 DIAGNOSIS — L400 Psoriasis vulgaris: Secondary | ICD-10-CM | POA: Diagnosis not present

## 2017-10-03 DIAGNOSIS — E039 Hypothyroidism, unspecified: Secondary | ICD-10-CM | POA: Diagnosis not present

## 2017-10-23 DIAGNOSIS — R05 Cough: Secondary | ICD-10-CM | POA: Diagnosis not present

## 2017-10-23 DIAGNOSIS — J3489 Other specified disorders of nose and nasal sinuses: Secondary | ICD-10-CM | POA: Diagnosis not present

## 2017-11-19 DIAGNOSIS — Z79899 Other long term (current) drug therapy: Secondary | ICD-10-CM | POA: Diagnosis not present

## 2017-11-19 DIAGNOSIS — L400 Psoriasis vulgaris: Secondary | ICD-10-CM | POA: Diagnosis not present

## 2018-02-11 DIAGNOSIS — M65341 Trigger finger, right ring finger: Secondary | ICD-10-CM | POA: Diagnosis not present

## 2018-02-11 DIAGNOSIS — M65831 Other synovitis and tenosynovitis, right forearm: Secondary | ICD-10-CM | POA: Diagnosis not present

## 2018-02-11 DIAGNOSIS — M65321 Trigger finger, right index finger: Secondary | ICD-10-CM | POA: Diagnosis not present

## 2018-02-12 DIAGNOSIS — D1801 Hemangioma of skin and subcutaneous tissue: Secondary | ICD-10-CM | POA: Diagnosis not present

## 2018-02-12 DIAGNOSIS — Z79899 Other long term (current) drug therapy: Secondary | ICD-10-CM | POA: Diagnosis not present

## 2018-02-12 DIAGNOSIS — L400 Psoriasis vulgaris: Secondary | ICD-10-CM | POA: Diagnosis not present

## 2018-02-12 DIAGNOSIS — L603 Nail dystrophy: Secondary | ICD-10-CM | POA: Diagnosis not present

## 2018-02-25 DIAGNOSIS — M65839 Other synovitis and tenosynovitis, unspecified forearm: Secondary | ICD-10-CM | POA: Diagnosis not present

## 2018-02-25 DIAGNOSIS — M25532 Pain in left wrist: Secondary | ICD-10-CM | POA: Diagnosis not present

## 2018-02-25 DIAGNOSIS — M6530 Trigger finger, unspecified finger: Secondary | ICD-10-CM | POA: Diagnosis not present

## 2018-03-25 IMAGING — RF DG CERVICAL SPINE 2 OR 3 VIEWS
1 series · 3 of 3 positions shown · non-contrast
Comparison: Plain film of the cervical spine dated 01/09/2017.

CLINICAL DATA: Surgery: C5-C6 disc arthroplasty Fluoro time: 38
seconds

EXAM:
CERVICAL SPINE - 2-3 VIEW; DG C-ARM 61-120 MIN

[Series 1: run · 3 of 3 slices shown]
[im 1/3]
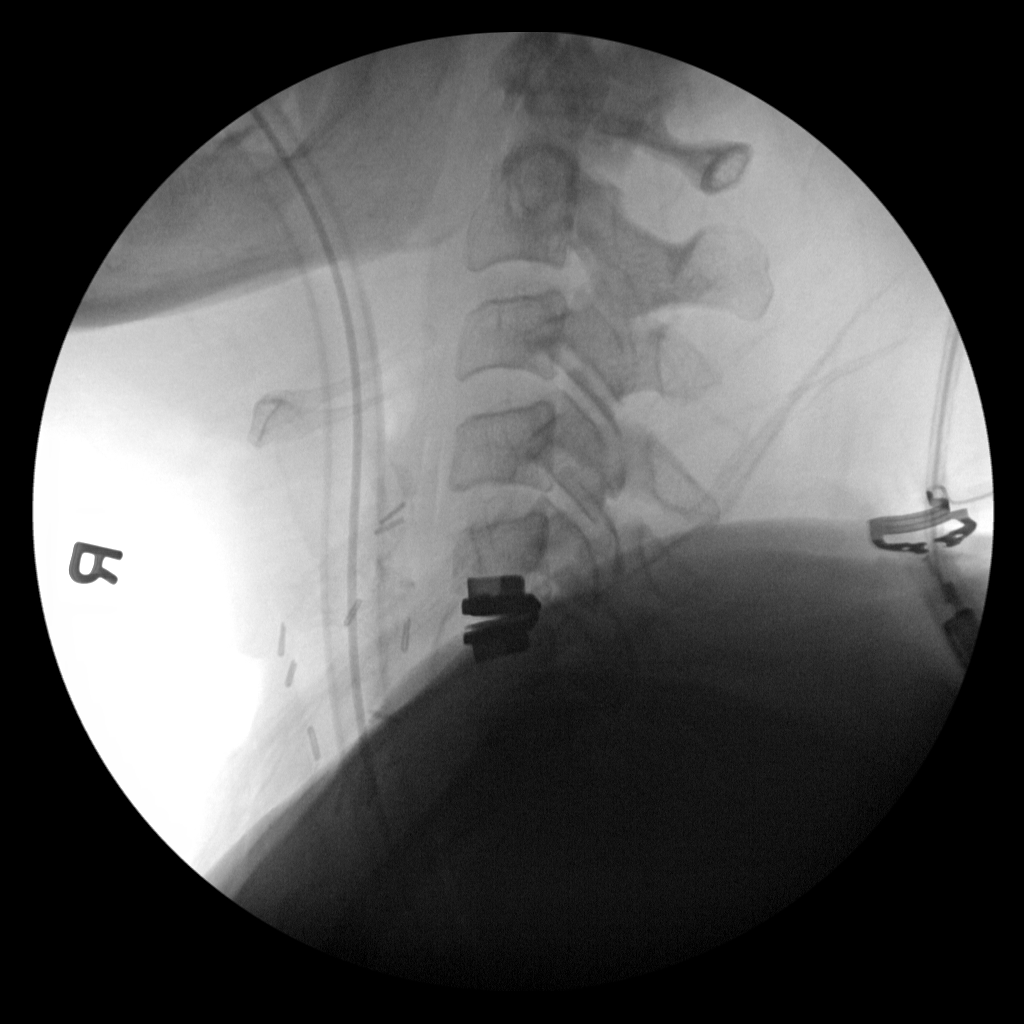
[im 2/3]
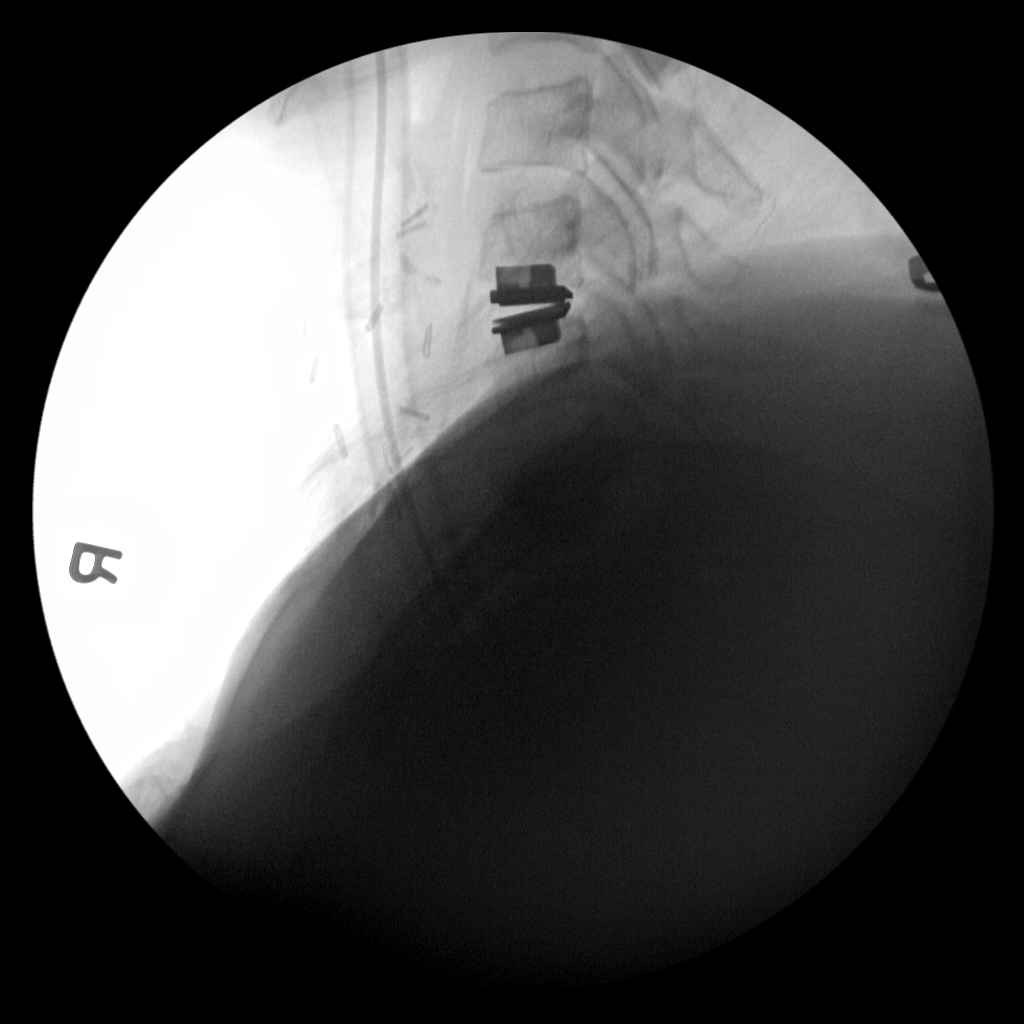
[im 3/3]
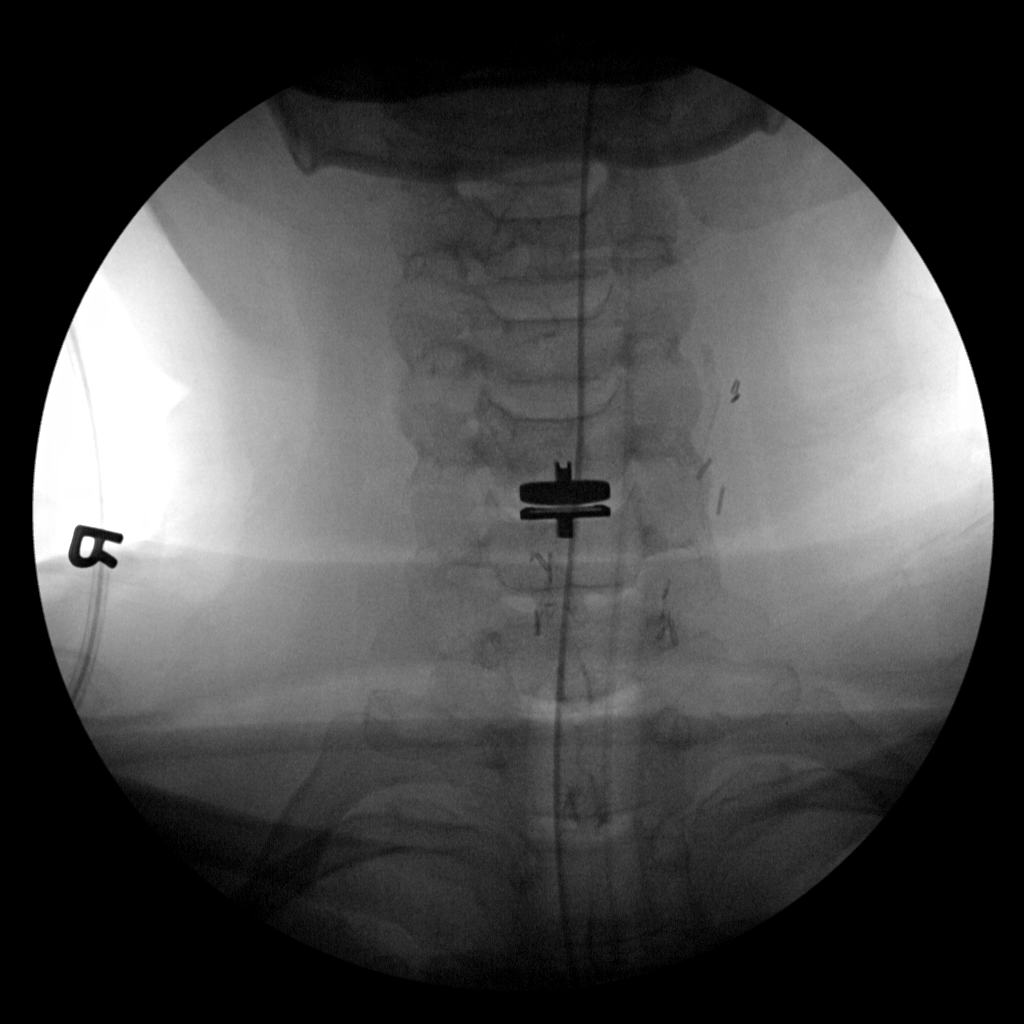

[3 of 3 positions shown; findings below may reference images not displayed]

FINDINGS: Intraoperative fluoroscopic AP and lateral views of the cervical
spine are provided. These fluoroscopic images show new hardware at
the C5-C6 disc space, appropriately positioned. Osseous alignment
appears anatomic. Fluoroscopy was provided for 38 seconds.
IMPRESSION: Intraoperative fluoroscopic demonstrating new hardware at the C5-6
disc space. No evidence of surgical complicating feature.

## 2018-05-06 DIAGNOSIS — M65832 Other synovitis and tenosynovitis, left forearm: Secondary | ICD-10-CM | POA: Diagnosis not present

## 2018-05-06 DIAGNOSIS — M25532 Pain in left wrist: Secondary | ICD-10-CM | POA: Diagnosis not present

## 2018-05-06 DIAGNOSIS — M65341 Trigger finger, right ring finger: Secondary | ICD-10-CM | POA: Diagnosis not present

## 2018-05-06 DIAGNOSIS — M65321 Trigger finger, right index finger: Secondary | ICD-10-CM | POA: Diagnosis not present

## 2018-05-12 DIAGNOSIS — L603 Nail dystrophy: Secondary | ICD-10-CM | POA: Diagnosis not present

## 2018-05-12 DIAGNOSIS — L400 Psoriasis vulgaris: Secondary | ICD-10-CM | POA: Diagnosis not present

## 2018-05-12 DIAGNOSIS — Z79899 Other long term (current) drug therapy: Secondary | ICD-10-CM | POA: Diagnosis not present

## 2018-07-02 DIAGNOSIS — M67432 Ganglion, left wrist: Secondary | ICD-10-CM | POA: Diagnosis not present

## 2018-07-02 DIAGNOSIS — M25532 Pain in left wrist: Secondary | ICD-10-CM | POA: Diagnosis not present

## 2018-07-02 DIAGNOSIS — M65321 Trigger finger, right index finger: Secondary | ICD-10-CM | POA: Diagnosis not present

## 2018-07-02 DIAGNOSIS — M65341 Trigger finger, right ring finger: Secondary | ICD-10-CM | POA: Diagnosis not present

## 2018-07-02 DIAGNOSIS — M65832 Other synovitis and tenosynovitis, left forearm: Secondary | ICD-10-CM | POA: Diagnosis not present

## 2018-08-06 DIAGNOSIS — M791 Myalgia, unspecified site: Secondary | ICD-10-CM | POA: Diagnosis not present

## 2018-08-06 DIAGNOSIS — J09X2 Influenza due to identified novel influenza A virus with other respiratory manifestations: Secondary | ICD-10-CM | POA: Diagnosis not present

## 2018-08-06 DIAGNOSIS — F17210 Nicotine dependence, cigarettes, uncomplicated: Secondary | ICD-10-CM | POA: Diagnosis not present

## 2018-08-17 DIAGNOSIS — Z79899 Other long term (current) drug therapy: Secondary | ICD-10-CM | POA: Diagnosis not present

## 2018-08-17 DIAGNOSIS — L400 Psoriasis vulgaris: Secondary | ICD-10-CM | POA: Diagnosis not present

## 2018-09-28 DIAGNOSIS — M65331 Trigger finger, right middle finger: Secondary | ICD-10-CM | POA: Diagnosis not present

## 2018-09-28 DIAGNOSIS — M65341 Trigger finger, right ring finger: Secondary | ICD-10-CM | POA: Diagnosis not present

## 2018-09-28 DIAGNOSIS — M79644 Pain in right finger(s): Secondary | ICD-10-CM | POA: Diagnosis not present

## 2018-09-28 DIAGNOSIS — M65321 Trigger finger, right index finger: Secondary | ICD-10-CM | POA: Diagnosis not present

## 2018-11-16 DIAGNOSIS — Z7189 Other specified counseling: Secondary | ICD-10-CM | POA: Diagnosis not present

## 2018-11-16 DIAGNOSIS — E559 Vitamin D deficiency, unspecified: Secondary | ICD-10-CM | POA: Diagnosis not present

## 2018-11-16 DIAGNOSIS — Z79899 Other long term (current) drug therapy: Secondary | ICD-10-CM | POA: Diagnosis not present

## 2018-11-16 DIAGNOSIS — F418 Other specified anxiety disorders: Secondary | ICD-10-CM | POA: Diagnosis not present

## 2018-11-16 DIAGNOSIS — L400 Psoriasis vulgaris: Secondary | ICD-10-CM | POA: Diagnosis not present

## 2018-11-16 DIAGNOSIS — E039 Hypothyroidism, unspecified: Secondary | ICD-10-CM | POA: Diagnosis not present

## 2018-11-16 DIAGNOSIS — E782 Mixed hyperlipidemia: Secondary | ICD-10-CM | POA: Diagnosis not present

## 2018-12-17 DIAGNOSIS — M65341 Trigger finger, right ring finger: Secondary | ICD-10-CM | POA: Diagnosis not present

## 2018-12-17 DIAGNOSIS — M65331 Trigger finger, right middle finger: Secondary | ICD-10-CM | POA: Diagnosis not present

## 2018-12-17 DIAGNOSIS — M65321 Trigger finger, right index finger: Secondary | ICD-10-CM | POA: Diagnosis not present

## 2018-12-24 DIAGNOSIS — M65321 Trigger finger, right index finger: Secondary | ICD-10-CM | POA: Diagnosis not present

## 2018-12-24 DIAGNOSIS — M65341 Trigger finger, right ring finger: Secondary | ICD-10-CM | POA: Diagnosis not present

## 2018-12-24 DIAGNOSIS — M65331 Trigger finger, right middle finger: Secondary | ICD-10-CM | POA: Diagnosis not present

## 2018-12-24 DIAGNOSIS — M79644 Pain in right finger(s): Secondary | ICD-10-CM | POA: Diagnosis not present

## 2018-12-31 DIAGNOSIS — M79641 Pain in right hand: Secondary | ICD-10-CM | POA: Diagnosis not present

## 2019-02-16 DIAGNOSIS — L400 Psoriasis vulgaris: Secondary | ICD-10-CM | POA: Diagnosis not present

## 2019-02-16 DIAGNOSIS — Z79899 Other long term (current) drug therapy: Secondary | ICD-10-CM | POA: Diagnosis not present

## 2019-05-24 DIAGNOSIS — L603 Nail dystrophy: Secondary | ICD-10-CM | POA: Diagnosis not present

## 2019-05-24 DIAGNOSIS — Z79899 Other long term (current) drug therapy: Secondary | ICD-10-CM | POA: Diagnosis not present

## 2019-05-24 DIAGNOSIS — L4050 Arthropathic psoriasis, unspecified: Secondary | ICD-10-CM | POA: Diagnosis not present

## 2019-05-24 DIAGNOSIS — E039 Hypothyroidism, unspecified: Secondary | ICD-10-CM | POA: Diagnosis not present

## 2019-05-24 DIAGNOSIS — L400 Psoriasis vulgaris: Secondary | ICD-10-CM | POA: Diagnosis not present

## 2020-03-23 ENCOUNTER — Ambulatory Visit: Payer: BLUE CROSS/BLUE SHIELD | Admitting: Family Medicine

## 2020-11-02 ENCOUNTER — Other Ambulatory Visit: Payer: Self-pay | Admitting: Obstetrics and Gynecology

## 2020-11-02 DIAGNOSIS — R928 Other abnormal and inconclusive findings on diagnostic imaging of breast: Secondary | ICD-10-CM

## 2020-11-28 ENCOUNTER — Ambulatory Visit
Admission: RE | Admit: 2020-11-28 | Discharge: 2020-11-28 | Disposition: A | Discharge status: Home / Self Care | Payer: BLUE CROSS/BLUE SHIELD | Source: Ambulatory Visit | Attending: Obstetrics and Gynecology | Admitting: Obstetrics and Gynecology

## 2020-11-28 ENCOUNTER — Other Ambulatory Visit: Payer: Self-pay

## 2020-11-28 ENCOUNTER — Ambulatory Visit: Payer: BLUE CROSS/BLUE SHIELD

## 2020-11-28 DIAGNOSIS — R928 Other abnormal and inconclusive findings on diagnostic imaging of breast: Secondary | ICD-10-CM

## 2021-10-07 IMAGING — MG MM DIGITAL DIAGNOSTIC UNILAT*R* W/ TOMO W/ CAD
6 of 12 series · 6 of 36 positions shown · non-contrast
Comparison: Previous exam(s).

CLINICAL DATA: Patient recalled from screening for right breast
possible distortion.

EXAM:
DIGITAL DIAGNOSTIC UNILATERAL RIGHT MAMMOGRAM WITH TOMOSYNTHESIS AND
CAD
TECHNIQUE: Right digital diagnostic mammography and breast tomosynthesis was
performed. The images were evaluated with computer-aided detection.

[R CC synth-2D (1 of 4)]
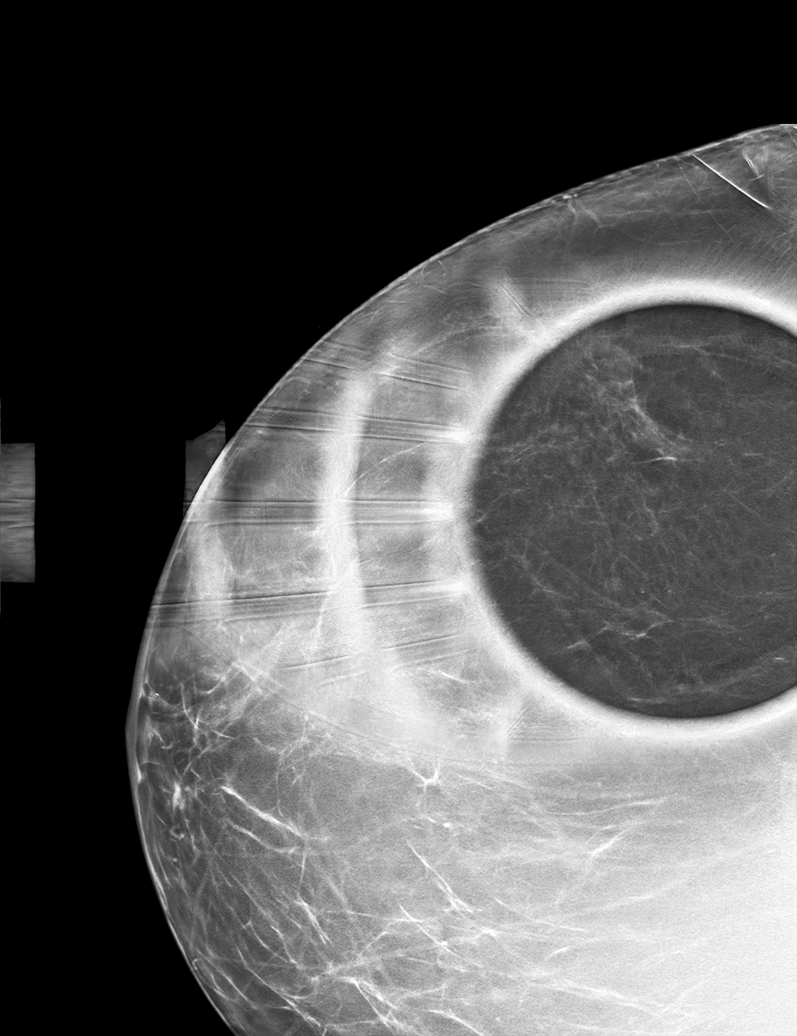

[R CC synth-2D (2 of 4)]
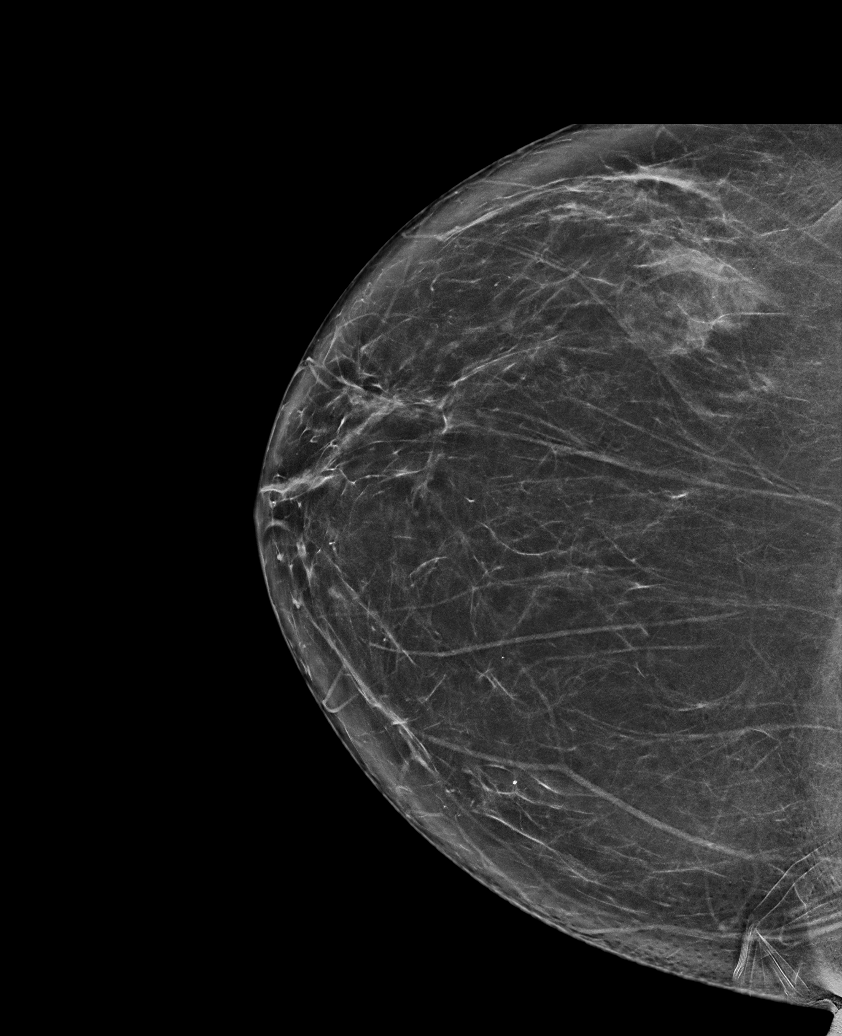

[R ML synth-2D]
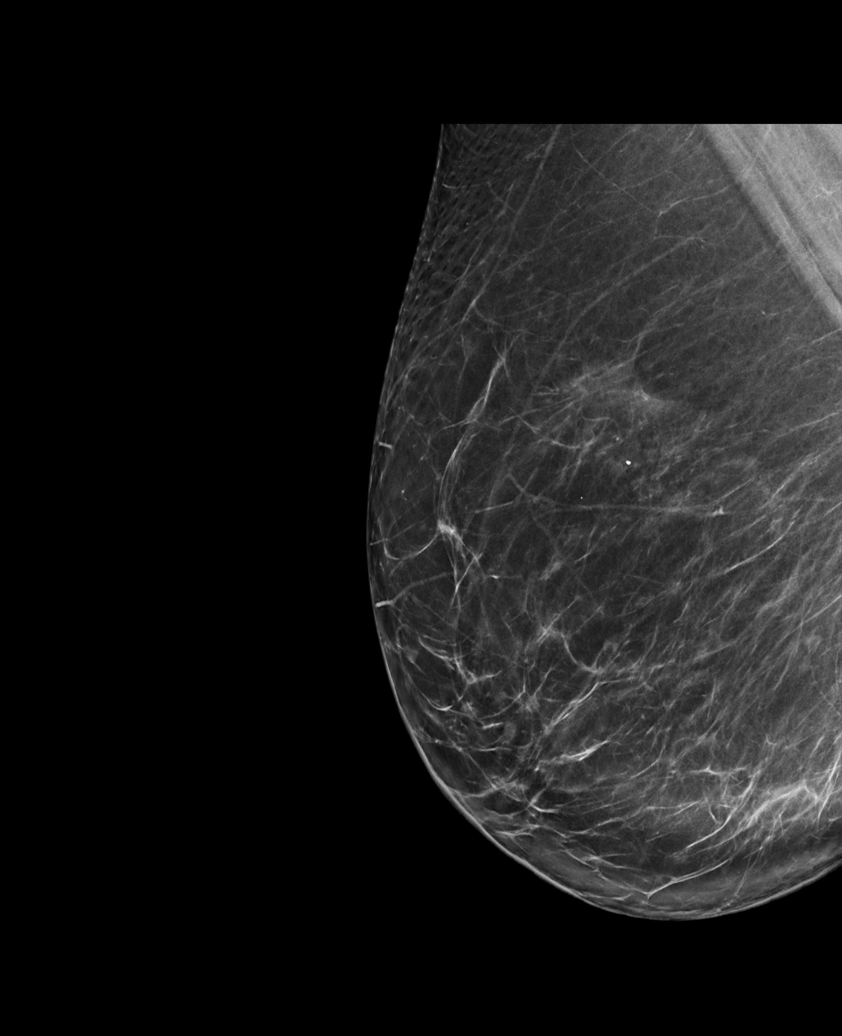

[R CC synth-2D (3 of 4)]
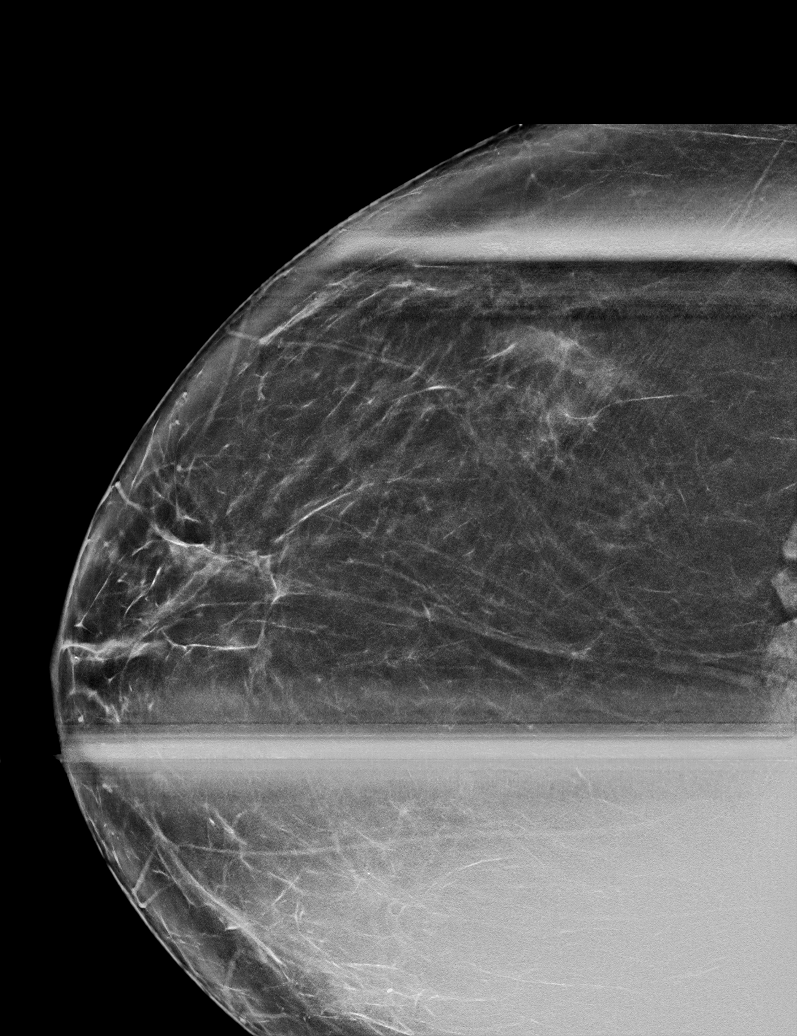

[R MLO synth-2D]
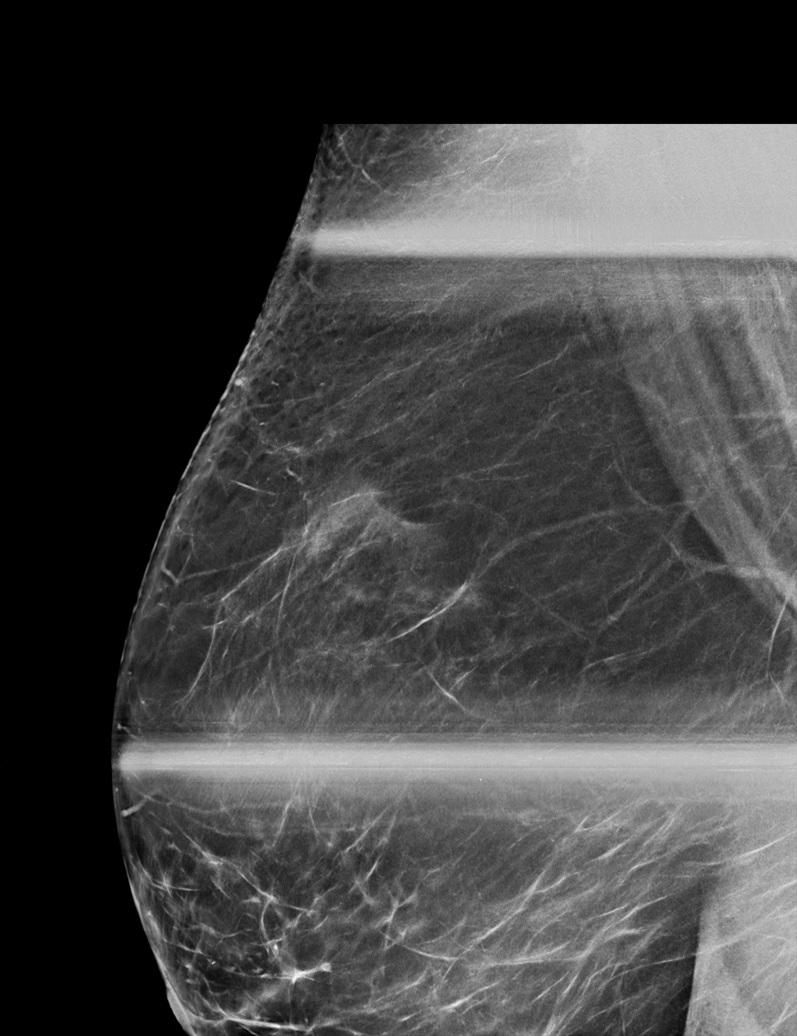

[R CC synth-2D (4 of 4)]
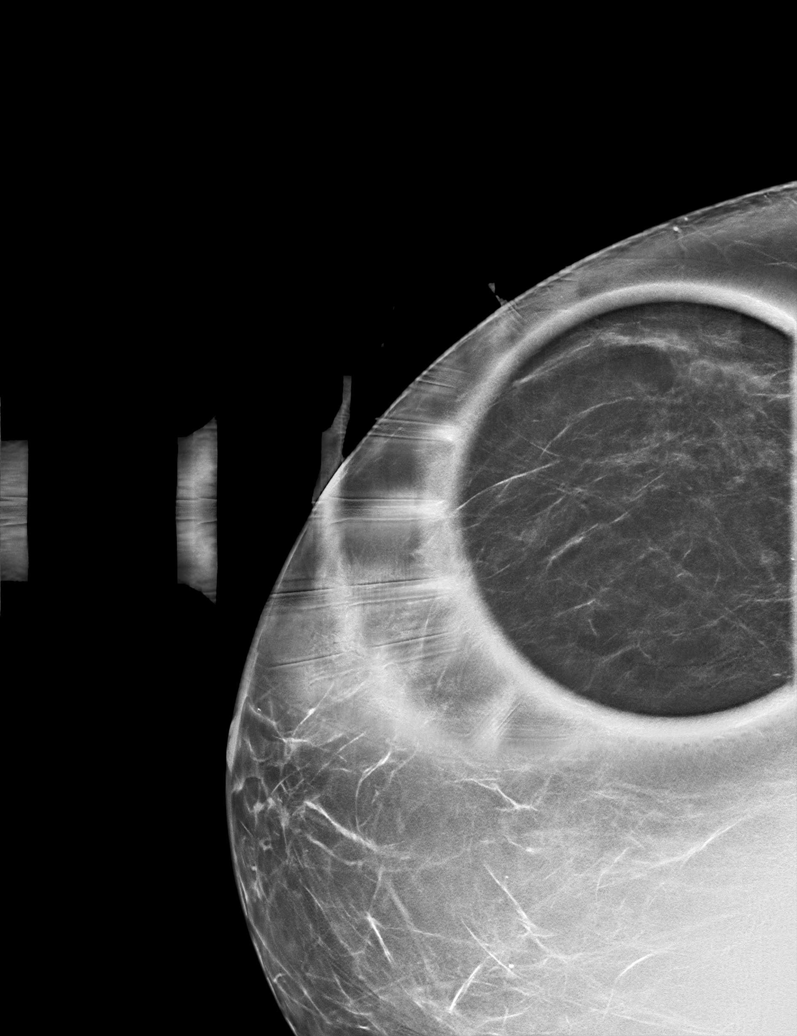

[6 of 36 positions shown; findings below may reference images not displayed]

ACR Breast Density Category b: There are scattered areas of
fibroglandular density.
FINDINGS: Questioned distortion within the outer right breast resolved with
additional imaging compatible with dense fibroglandular tissue. No
suspicious findings on additional imaging.
IMPRESSION: No mammographic evidence for malignancy.

RECOMMENDATION:
Screening mammogram in one year.(Code:[id number])

I have discussed the findings and recommendations with the patient.
If applicable, a reminder letter will be sent to the patient
regarding the next appointment.

BI-RADS CATEGORY  2: Benign.

## 2022-09-12 ENCOUNTER — Other Ambulatory Visit: Payer: Self-pay | Admitting: Obstetrics and Gynecology

## 2022-09-12 DIAGNOSIS — Z720 Tobacco use: Secondary | ICD-10-CM

## 2022-10-24 ENCOUNTER — Inpatient Hospital Stay: Admission: RE | Admit: 2022-10-24 | Payer: BC Managed Care – PPO | Source: Ambulatory Visit

## 2023-04-15 ENCOUNTER — Ambulatory Visit (HOSPITAL_BASED_OUTPATIENT_CLINIC_OR_DEPARTMENT_OTHER): Payer: 59 | Admitting: Internal Medicine

## 2023-04-15 ENCOUNTER — Encounter (HOSPITAL_BASED_OUTPATIENT_CLINIC_OR_DEPARTMENT_OTHER): Payer: Self-pay | Admitting: Internal Medicine

## 2023-04-15 VITALS — BP 124/78 | HR 67 | Ht 63.0 in | Wt 167.2 lb

## 2023-04-15 DIAGNOSIS — Z720 Tobacco use: Secondary | ICD-10-CM

## 2023-04-15 DIAGNOSIS — E782 Mixed hyperlipidemia: Secondary | ICD-10-CM

## 2023-04-15 NOTE — Progress Notes (Addendum)
LIPID CLINIC CONSULT NOTE  Chief Complaint:  Manage dyslipidemia  Primary Care Physician: Patient, No Pcp Per  Primary Cardiologist:  None  HPI:  Tricia Mcdonald is a 52 y.o. female who is being seen today for the evaluation of dyslipidemia at the request of Richardean Chimera, MD. this is a pleasant 52 year old female kindly referred by Dr. Arelia Sneddon for evaluation management of dyslipidemia.  She had recent lipids performed in May which showed total cholesterol 176, HDL 40, triglycerides 101 and LDL 117.  Her cholesterol was still above normal at that point.  We discussed this today and she noted that she was not regularly taking her pravastatin.  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  She would say over the past couple months though she has been more consistent with taking her statin medication and is trying to eat healthier and reduce stacking at work.  She said she generally works third shift.  She does have a history of hypothyroidism as well, however recent thyroid testing appears reasonably well-controlled with a mildly elevated TSH at 6.3 but normal T4 and T3.  Her other risk factors include ongoing tobacco use.  She was sent for screening CT to rule out lung cancer however her insurance denied this.  She does not have a history of diabetes or hypertension.  There is no early onset heart disease in the family.  PMHx:  Past Medical History:  Diagnosis Date   Complication of anesthesia    Hyperlipidemia    PONV (postoperative nausea and vomiting)    Thyroid disease     Past Surgical History:  Procedure Laterality Date   ABDOMINAL HYSTERECTOMY  2012   CERVICAL DISC ARTHROPLASTY N/A 05/16/2017   Procedure: TOTAL DISC REPLACEMENT CERVICAL FIVE THROUGH SIX;  Surgeon: Venita Lick, MD;  Location: [facility] OR;  Service: Orthopedics;  Laterality: N/A;  2.5 hrs   HAND SURGERY      THYROIDECTOMY  2009   transvaginal mesh      FAMHx:  Family History  Problem Relation Age of Onset   Diabetes Father    Hypertension Father    Cancer Father        skin   Hypertension Brother    Thyroid disease Paternal Grandmother    Diverticulitis Sister     SOCHx:   reports that she has been smoking cigarettes. She has never used smokeless tobacco. She reports that she does not drink alcohol and does not use drugs.  ALLERGIES:  No Known Allergies  ROS: Pertinent items noted in HPI and remainder of comprehensive ROS otherwise negative.  HOME MEDS: Current Outpatient Medications on File Prior to Visit  Medication Sig Dispense Refill   escitalopram (LEXAPRO) 10 MG tablet Take 5 mg by mouth daily.     levothyroxine (SYNTHROID, LEVOTHROID) 175 MCG tablet Take 175 mcg by mouth daily before breakfast.      pravastatin (PRAVACHOL) 20 MG tablet Take 1 tablet (20 mg total) by mouth daily. 90 tablet 0   methocarbamol (ROBAXIN) 500 MG tablet Take 1 tablet (500 mg total) by mouth 3 (three) times daily as needed for muscle spasms. (Patient not taking: Reported on 04/15/2023) 15 tablet 0   ondansetron (ZOFRAN) 4 MG tablet Take 1 tablet (4 mg total) by mouth every 8 (eight) hours as needed for nausea or vomiting. (Patient not taking: Reported on 04/15/2023) 20  tablet 0   oxyCODONE-acetaminophen (PERCOCET) 10-325 MG tablet Take 1 tablet by mouth every 6 (six) hours as needed for pain. (Patient not taking: Reported on 04/15/2023) 20 tablet 0   polyethylene glycol (MIRALAX / GLYCOLAX) packet Take 17 g by mouth daily as needed for mild constipation. (Patient not taking: Reported on 04/15/2023) 14 each 0   ustekinumab (STELARA) 45 MG/0.5ML SOSY injection Inject 45 mg into the skin once. (Patient not taking: Reported on 04/15/2023)     ustekinumab (STELARA) 90 MG/ML SOSY injection Inject 90 mg into the skin once. (Patient not taking: Reported on 04/15/2023)     No current facility-administered  medications on file prior to visit.    LABS/IMAGING: No results found for this or any previous visit (from the past 48 hour(s)). No results found.  LIPID PANEL:    Component Value Date/Time   CHOL 248 (H) 04/04/2017 1402   TRIG 336 (H) 04/04/2017 1402   HDL 28 (L) 04/04/2017 1402   CHOLHDL 8.9 (H) 04/04/2017 1402   LDLCALC 153 (H) 04/04/2017 1402    WEIGHTS: Wt Readings from Last 3 Encounters:  04/15/23 167 lb 3.2 oz (75.8 kg)  05/16/17 170 lb (77.1 kg)  05/08/17 170 lb 8 oz (77.3 kg)    VITALS: BP 124/78   Pulse 67   Ht 5\' 3"  (1.6 m)   Wt 167 lb 3.2 oz (75.8 kg)   SpO2 96%   BMI 29.62 kg/m   EXAM: Deferred  EKG: Deferred  ASSESSMENT: Mixed dyslipidemia Tobacco use  PLAN: 1.   Tricia Mcdonald has a mixed dyslipidemia and arguably her cholesterol is higher than target.  Could be helpful to further risk stratify her with a calcium score.  This would also give [place] an opportunity to do a lung screening given her tobacco history and she would be willing to pay the $99 out-of-pocket charge for that.  Will also reassess her lipids since she has been more compliant with her medication recently and make adjustments accordingly.  Check an NMR and LP(a).  I will plan follow-up afterwards.  Thanks again for the kind referral.  Chrystie Nose, MD, [facility]  [place]  [facility]  Medical Director of the [facility]id [facility] &  Cardiovascular Risk Reduction Clinic Diplomate of the American Board of Clinical Lipidology Attending Cardiologist  Direct Dial: [phone]  Fax: [phone]  Website:  [email or web address] 04/15/2023, 12:04 PM

## 2023-04-15 NOTE — Patient Instructions (Signed)
Medication Instructions:  Your physician recommends that you continue on your current medications as directed. Please refer to the Current Medication list given to you today.   Labwork: LPa/NMR TODAY   Testing/Procedures: CALCIUM SCORE - THIS TWILL $99 OUT OF POCKET   Follow-Up: TO BE DETERMINED BASED ON RESULTS   If you need a refill on your cardiac medications before your next appointment, please call your pharmacy.

## 2023-04-16 LAB — NMR, LIPOPROFILE
Cholesterol, Total: 174 mg/dL (ref 100–199)
HDL Particle Number: 30.9 umol/L (ref >=30.5)
HDL-C: 37 mg/dL — ABNORMAL LOW (ref >39)
LDL Particle Number: 1229 nmol/L — ABNORMAL HIGH (ref <1000)
LDL Size: 21.4 nm (ref >20.5)
LDL-C (NIH Calc): 109 mg/dL — ABNORMAL HIGH (ref 0–99)
LP-IR Score: 71 — ABNORMAL HIGH (ref <=45)
Small LDL Particle Number: 441 nmol/L (ref <=527)
Triglycerides: 160 mg/dL — ABNORMAL HIGH (ref 0–149)

## 2023-04-16 LAB — LIPOPROTEIN A (LPA): Lipoprotein (a): 53.2 nmol/L (ref <75.0)

## 2023-04-21 ENCOUNTER — Encounter: Payer: Self-pay | Admitting: Internal Medicine

## 2023-06-04 ENCOUNTER — Other Ambulatory Visit (HOSPITAL_BASED_OUTPATIENT_CLINIC_OR_DEPARTMENT_OTHER): Payer: 59

## 2023-08-29 ENCOUNTER — Other Ambulatory Visit (HOSPITAL_BASED_OUTPATIENT_CLINIC_OR_DEPARTMENT_OTHER): Payer: Self-pay

## 2023-08-29 DIAGNOSIS — E782 Mixed hyperlipidemia: Secondary | ICD-10-CM

## 2023-10-21 ENCOUNTER — Ambulatory Visit (HOSPITAL_BASED_OUTPATIENT_CLINIC_OR_DEPARTMENT_OTHER): Payer: Self-pay | Admitting: Internal Medicine

## 2023-10-21 ENCOUNTER — Ambulatory Visit (HOSPITAL_BASED_OUTPATIENT_CLINIC_OR_DEPARTMENT_OTHER)
Admission: RE | Admit: 2023-10-21 | Discharge: 2023-10-21 | Disposition: A | Discharge status: Home / Self Care | Payer: Self-pay | Source: Ambulatory Visit | Attending: Internal Medicine | Admitting: Internal Medicine

## 2023-10-21 DIAGNOSIS — E782 Mixed hyperlipidemia: Secondary | ICD-10-CM | POA: Insufficient documentation

## 2023-11-12 ENCOUNTER — Encounter (HOSPITAL_BASED_OUTPATIENT_CLINIC_OR_DEPARTMENT_OTHER): Payer: Self-pay | Admitting: Nurse Practitioner

## 2023-11-12 ENCOUNTER — Ambulatory Visit (HOSPITAL_BASED_OUTPATIENT_CLINIC_OR_DEPARTMENT_OTHER): Payer: Self-pay | Admitting: Nurse Practitioner

## 2023-11-12 VITALS — BP 112/66 | HR 65 | Ht 62.0 in | Wt 167.0 lb

## 2023-11-12 DIAGNOSIS — E782 Mixed hyperlipidemia: Secondary | ICD-10-CM

## 2023-11-12 DIAGNOSIS — E039 Hypothyroidism, unspecified: Secondary | ICD-10-CM

## 2023-11-12 DIAGNOSIS — I2510 Atherosclerotic heart disease of native coronary artery without angina pectoris: Secondary | ICD-10-CM | POA: Diagnosis not present

## 2023-11-12 DIAGNOSIS — Z720 Tobacco use: Secondary | ICD-10-CM | POA: Diagnosis not present

## 2023-11-12 DIAGNOSIS — E785 Hyperlipidemia, unspecified: Secondary | ICD-10-CM

## 2023-11-12 DIAGNOSIS — E119 Type 2 diabetes mellitus without complications: Secondary | ICD-10-CM

## 2023-11-12 DIAGNOSIS — E781 Pure hyperglyceridemia: Secondary | ICD-10-CM

## 2023-11-12 MED ORDER — NICOTINE 21 MG/24HR TD PT24: 21.0000 mg | MEDICATED_PATCH | Freq: Every day | TRANSDERMAL | 0 refills | Status: AC

## 2023-11-12 MED ORDER — NICOTINE 7 MG/24HR TD PT24: 7.0000 mg | MEDICATED_PATCH | Freq: Every day | TRANSDERMAL | 0 refills | Status: AC

## 2023-11-12 MED ORDER — NICOTINE 14 MG/24HR TD PT24: 14.0000 mg | MEDICATED_PATCH | Freq: Every day | TRANSDERMAL | 0 refills | Status: AC

## 2023-11-12 NOTE — Progress Notes (Signed)
 Cardiology Office Note   Date:  11/13/2023  ID:  Tricia Mcdonald, DOB [birth year], MRN [id number] PCP: Patient, No Pcp Per  [facility] Providers Cardiologist:  None     PMH Dyslipidemia Hypothyroidism Tobacco use Coronary artery calcification CT Calcium  score 8.86 (76th percentile) LM 0, LAD, 0, LCx 1.53, RCA 7.33  Referred to advanced lipid disorders clinic and seen by Dr. Maximo Spar 04/15/2023.  She had lipids May 2024 which revealed total cholesterol 176, HDL 40, triglycerides 101, and LDL 117.  She admitted to not regularly taking pravastatin .  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  She was working on eating a healthier diet and reducing smoking.  She has history of hypothyroidism, but recent thyroid  testing revealed well-controlled disease.  No history of early onset heart disease in her family.  She underwent CT calcium  score for further risk stratification.  NMR lipid profile 04/15/2023 revealed LDL particle #1229, LDL-C 102, triglycerides 160, small LDL particle #441. Goal LDL is < 70.   History of Present Illness Discussed the use of AI scribe software for clinical note transcription with the patient, who gave verbal consent to proceed.  History of Present Illness Tricia Mcdonald is a very pleasant 53 year old female who is here today for follow-up of CAD and  hyperlipidemia. She is excited to report that she has decreased smoking to 3 cigs/day. She has been smoking primarily at work due to stress but notes that cigarettes now taste terrible, reducing her smoking. 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 No chest pain. She notes shortness of breath, though she attributes any this to to being 'old and fat'. We discussed recent cardiac CT scan which showed a calcium  score of 8.86 Agatston units (76th percentile). She has a family history of high blood pressure and diabetes in her father. She is  borderline diabetic but not on medication for it. Her current medications include Lexapro, pravastatin , and levothyroxine  125 mcg. She engages in gardening as a physical activity and reports no issues with lifting or walking. She denies palpitations, orthopnea, PND, edema, presyncope, syncope.  Diet includes lunch daily, often consisting of healthier options like turkey pesto panini or chicken tortilla soup, but she admits to snacking at night. She avoids fried foods and grows her own vegetables, recently purchasing a greenhouse to grow them year-round.  ROS: See HPI  Studies Reviewed EKG Interpretation Date/Time:  Wednesday November 12 2023 15:40:11 EDT Ventricular Rate:  65 PR Interval:  150 QRS Duration:  80 QT Interval:  446 QTC Calculation: 463 R Axis:   29  Text Interpretation: Normal sinus rhythm Normal ECG No previous ECGs available Confirmed by Slater Duncan [phone]) on 11/12/2023 4:10:47 PM     Lipoprotein (a)  Date/Time Value Ref Range Status  04/15/2023 09:42 AM 53.2 <75.0 nmol/L Final    Comment:    Note:  Values greater than or equal to 75.0 nmol/L may        indicate an independent risk factor for CHD,        but must be evaluated with caution when applied        to non-Caucasian populations due to the        influence of genetic factors on Lp(a) across        ethnicities.     Risk Assessment/Calculations           Physical  Exam VS:  BP 112/66   Pulse 65   Ht 5' 2 (1.575 m)   Wt 167 lb (75.8 kg)   SpO2 97%   BMI 30.54 kg/m    Wt Readings from Last 3 Encounters:  11/12/23 167 lb (75.8 kg)  04/15/23 167 lb 3.2 oz (75.8 kg)  05/16/17 170 lb (77.1 kg)    GEN: Well nourished, well developed in no acute distress NECK: No JVD; No carotid bruits CARDIAC: RRR, no murmurs, rubs, gallops RESPIRATORY:  Clear to auscultation without rales, wheezing or rhonchi  ABDOMEN: Soft, non-tender, non-distended EXTREMITIES:  No edema; No deformity   ASSESSMENT AND  PLAN Assessment and Plan Assessment & Plan Coronary artery calcification CT calcium  score 10/21/2023 with CAC of 8.86 (76 percentile). We reviewed these findings in detail. EKG today is unremarkable.  She denies chest pain, dyspnea, or other symptoms concerning for angina.  No indication for further ischemic evaluation at this time. Focus on secondary prevention including heart healthy mostly plant based diet avoiding saturated fat, processed foods, simple carbohydrates, and sugar along with aiming for at least 150 minutes of moderate intensity exercise each week. Continue pravastatin  for now. We are rechecking lipids to ensure LDL is at goal of < 70.    Hyperlipidemia  LDL goal < 70 Lipid panel completed 04/15/2023 with total cholesterol 174, HDL 37, LDL 109, and triglycerides 160.  She had plan to get lab work prior to today's visit but was unable to.  We will have her get lab work as soon as she is able.  She is willing to consider a more potent lipid-lowering agent if LDL remains above goal.  She has been on pravastatin  for many years and has tolerated it well.  LP(a) is not elevated.    Smoking She has cut down to 3 cigarettes/day.  She request patches to help her achieve complete cessation.  Patches were prescribed and I congratulated her on her efforts to quit.  Complete cessation advised.  Diabetes   A1c 6.2% 09/01/2023.  She reports intolerance to metformin.  She is working on eating a healthier diet.  I have encouraged her to increase physical activity and limit simple carbohydrates.  Management per PCP.  Hypothyroidism   She reports thyroid  levels have been well-controlled.  She has been on the same dose of levothyroxine  for some time.  Management per PCP.          Dispo: 6 months with me  Signed, Slater Duncan, NP-C

## 2023-11-12 NOTE — Patient Instructions (Signed)
 Medication Instructions:   START Place 1 patch (21 mg total) onto the skin daily for 28 days than, START Place 1 patch (14 mg total) onto the skin daily for 28 days than, START Place 1 patch (7 mg total) onto the skin daily for 28 days.   *If you need a refill on your cardiac medications before your next appointment, please call your pharmacy*  Lab Work:  Your physician recommends that you return for a FASTING NMR/CMET fasting after midnight.    If you have labs (blood work) drawn today and your tests are completely normal, you will receive your results only by: MyChart Message (if you have MyChart) OR A paper copy in the mail If you have any lab test that is abnormal or we need to change your treatment, we will call you to review the results.  Testing/Procedures:  None ordered.   Follow-Up: At [facility], you and your health needs are our priority.  As part of our continuing mission to provide you with exceptional heart care, our providers are all part of one team.  This team includes your primary Cardiologist (physician) and Advanced Practice Providers or APPs (Physician Assistants and Nurse Practitioners) who all work together to provide you with the care you need, when you need it.  Your next appointment:   6 month(s)  Provider:   Slater Duncan, NP    We recommend signing up for the patient portal called MyChart.  Sign up information is provided on this After Visit Summary.  MyChart is used to connect with patients for Virtual Visits (Telemedicine).  Patients are able to view lab/test results, encounter notes, upcoming appointments, etc.  Non-urgent messages can be sent to your provider as well.   To learn more about what you can do with MyChart, go to [email or web address].   Other Instructions  Your physician wants you to follow-up in: 6 months.  You will receive a reminder letter in the mail two months in advance. If you don't receive a letter, please call  our office to schedule the follow-up appointment.  Adopting a Healthy Lifestyle.   Weight: Know what a healthy weight is for you (roughly BMI <25) and aim to maintain this. You can calculate your body mass index on your smart phone. Unfortunately, this is not the most accurate measure of healthy weight, but it is the simplest measurement to use. A more accurate measurement involves body scanning which measures lean muscle, fat tissue and bony density. We do not have this equipment at [facility].    Diet: Aim for 7+ servings of fruits and vegetables daily Limit animal fats in diet for cholesterol and heart health - choose grass fed whenever available Avoid highly processed foods (fast food burgers, tacos, fried chicken, pizza, hot dogs, french fries)  Saturated fat comes in the form of butter, lard, coconut oil, margarine, partially hydrogenated oils, and fat in meat. These increase your risk of cardiovascular disease.  Use healthy plant oils, such as olive, canola, soy, corn, sunflower and peanut.  Whole foods such as fruits, vegetables and whole grains have fiber  Men need > 38 grams of fiber per day Women need > 25 grams of fiber per day  Load up on vegetables and fruits - one-half of your plate: Aim for color and variety, and remember that potatoes dont count. Go for whole grains - one-quarter of your plate: Whole wheat, barley, wheat berries, quinoa, oats, brown rice, and foods made with them. If you want pasta,  go with whole wheat pasta. Protein power - one-quarter of your plate: Fish, chicken, beans, and nuts are all healthy, versatile protein sources. Limit red meat. You need carbohydrates for energy! The type of carbohydrate is more important than the amount. Choose carbohydrates such as vegetables, fruits, whole grains, beans, and nuts in the place of white rice, white pasta, potatoes (baked or fried), macaroni and cheese, cakes, cookies, and donuts.  If youre thirsty, drink water. Coffee  and tea are good in moderation, but skip sugary drinks and limit milk and dairy products to one or two daily servings. Keep sugar intake at 6 teaspoons or 24 grams or LESS       Exercise: Aim for 150 min of moderate intensity exercise weekly for heart health, and weights twice weekly for bone health Stay active - any steps are better than no steps! Aim for 7-9 hours of sleep daily

## 2023-11-13 ENCOUNTER — Encounter (HOSPITAL_BASED_OUTPATIENT_CLINIC_OR_DEPARTMENT_OTHER): Payer: Self-pay | Admitting: Nurse Practitioner
